# Patient Record
Sex: Female | Born: 1995 | Race: White | Hispanic: No | Marital: Single | Smoking: Former smoker
Health system: Southern US, Community
[De-identification: ages and names within clinical notes are randomized; demographics above are authoritative.]

## PROBLEM LIST (undated history)

## (undated) DIAGNOSIS — N920 Excessive and frequent menstruation with regular cycle: Secondary | ICD-10-CM

## (undated) DIAGNOSIS — Q249 Congenital malformation of heart, unspecified: Secondary | ICD-10-CM

## (undated) DIAGNOSIS — F419 Anxiety disorder, unspecified: Secondary | ICD-10-CM

## (undated) DIAGNOSIS — B078 Other viral warts: Secondary | ICD-10-CM

## (undated) DIAGNOSIS — L918 Other hypertrophic disorders of the skin: Secondary | ICD-10-CM

## (undated) DIAGNOSIS — N644 Mastodynia: Secondary | ICD-10-CM

## (undated) DIAGNOSIS — N83201 Unspecified ovarian cyst, right side: Secondary | ICD-10-CM

## (undated) DIAGNOSIS — D249 Benign neoplasm of unspecified breast: Secondary | ICD-10-CM

## (undated) DIAGNOSIS — N6011 Diffuse cystic mastopathy of right breast: Secondary | ICD-10-CM

## (undated) DIAGNOSIS — K582 Mixed irritable bowel syndrome: Secondary | ICD-10-CM

## (undated) DIAGNOSIS — K2900 Acute gastritis without bleeding: Secondary | ICD-10-CM

## (undated) DIAGNOSIS — N6012 Diffuse cystic mastopathy of left breast: Secondary | ICD-10-CM

## (undated) HISTORY — DX: Mixed irritable bowel syndrome: K58.2

## (undated) HISTORY — DX: Diffuse cystic mastopathy of right breast: N60.11

## (undated) HISTORY — DX: Unspecified ovarian cyst, right side: N83.201

## (undated) HISTORY — DX: Mastodynia: N64.4

## (undated) HISTORY — DX: Benign neoplasm of unspecified breast: D24.9

## (undated) HISTORY — DX: Acute gastritis without bleeding: K29.00

## (undated) HISTORY — DX: Diffuse cystic mastopathy of left breast: N60.12

## (undated) HISTORY — DX: Other viral warts: B07.8

## (undated) HISTORY — PX: WISDOM TOOTH EXTRACTION: SHX21

## (undated) HISTORY — DX: Other hypertrophic disorders of the skin: L91.8

## (undated) HISTORY — DX: Excessive and frequent menstruation with regular cycle: N92.0

---
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
## 2013-02-04 IMAGING — US US PELVIS COMPLETE
1 series · 13 of 25 positions shown · non-contrast
Comparison: 07/15/2012

CLINICAL DATA: Right-sided pelvic pain.   LMP 10/13/2012

TRANSABDOMINAL ULTRASOUND OF PELVIS
TECHNIQUE: Transabdominal ultrasound examination of the pelvis was
performed including evaluation of the uterus, ovaries, adnexal
regions, and pelvic cul-de-sac.

[Series 1: us pelvis complete · 0.20mm/px · 13 of 97 slices shown]
[im 1/97]
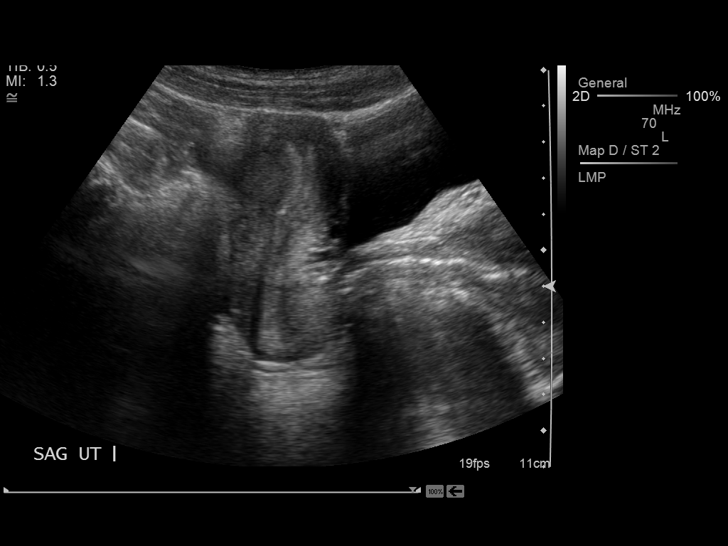
[im 9/97]
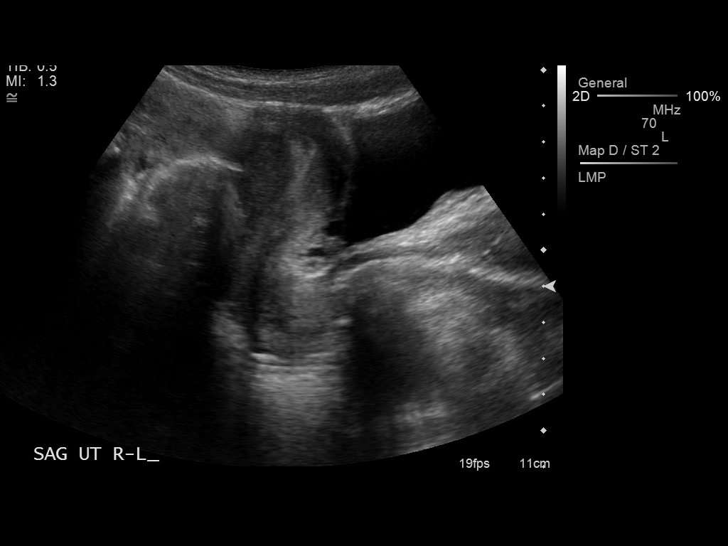
[im 17/97]
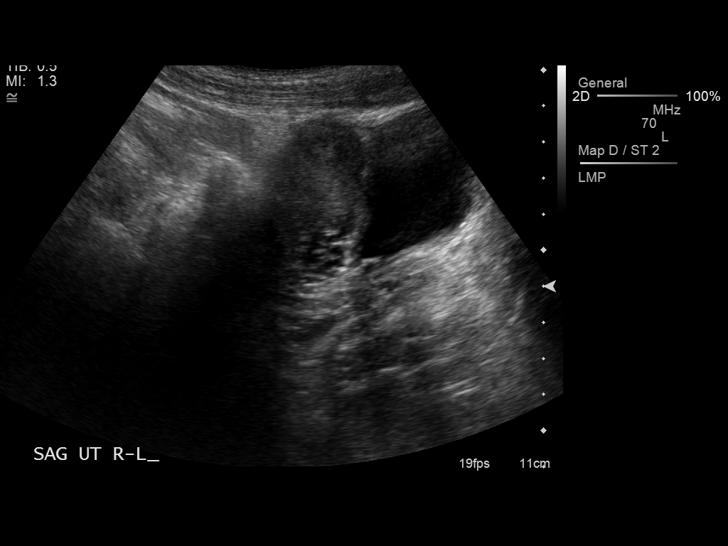
[im 25/97]
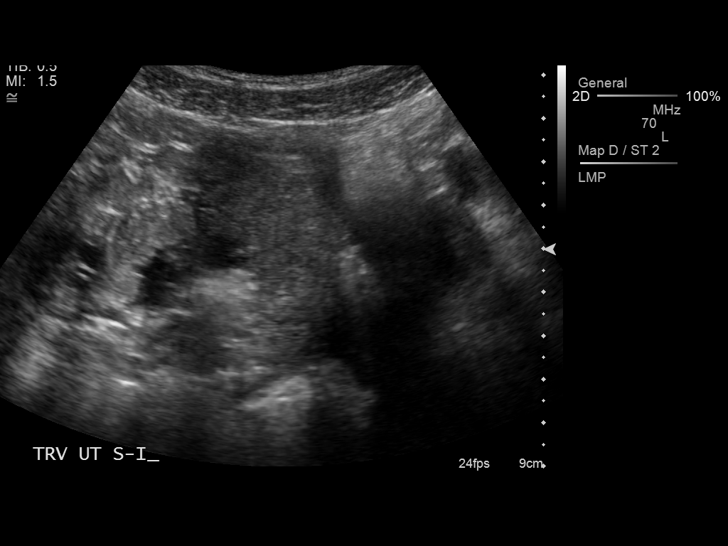
[im 33/97]
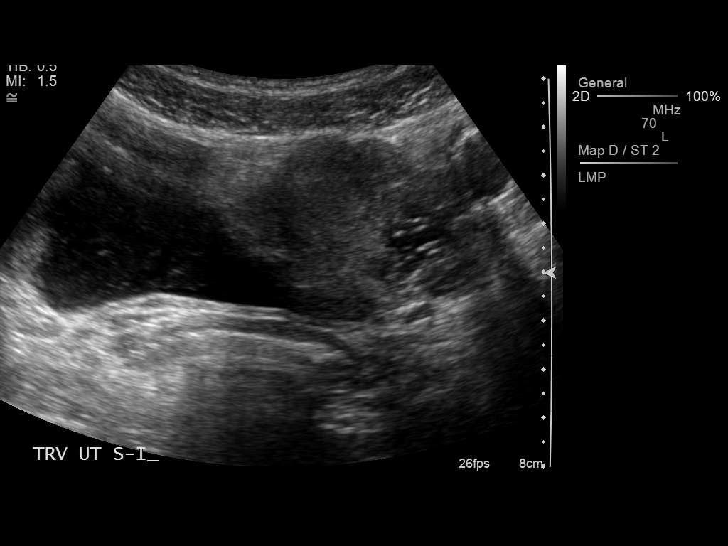
[im 41/97]
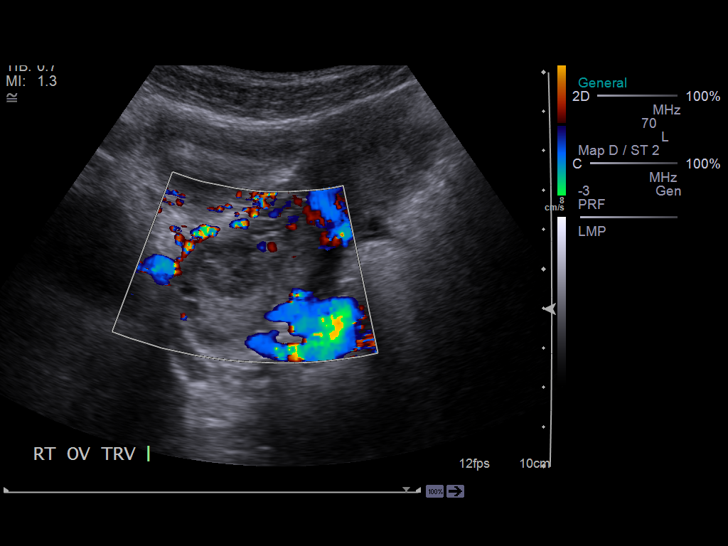
[im 49/97]
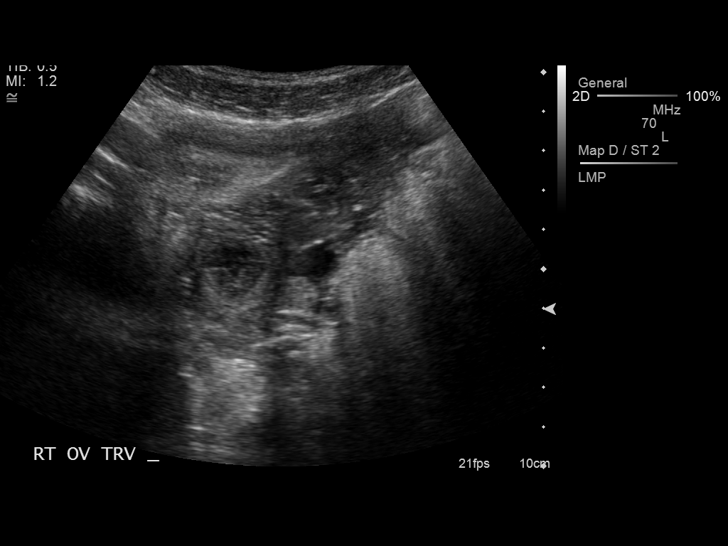
[im 57/97]
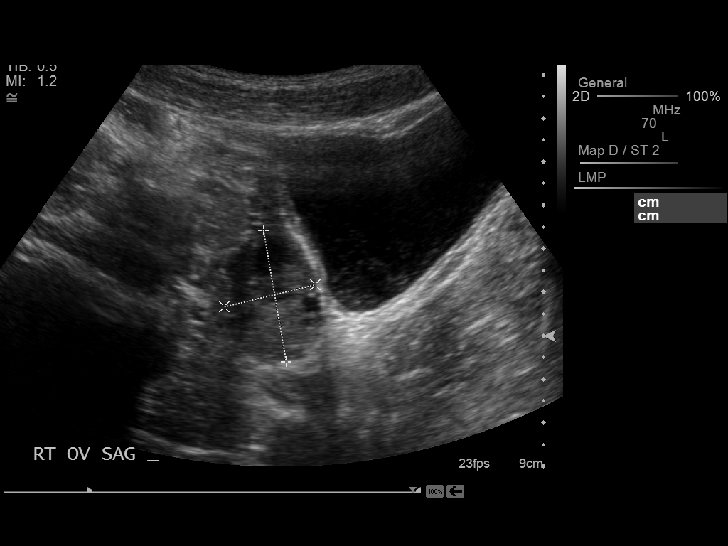
[im 65/97]
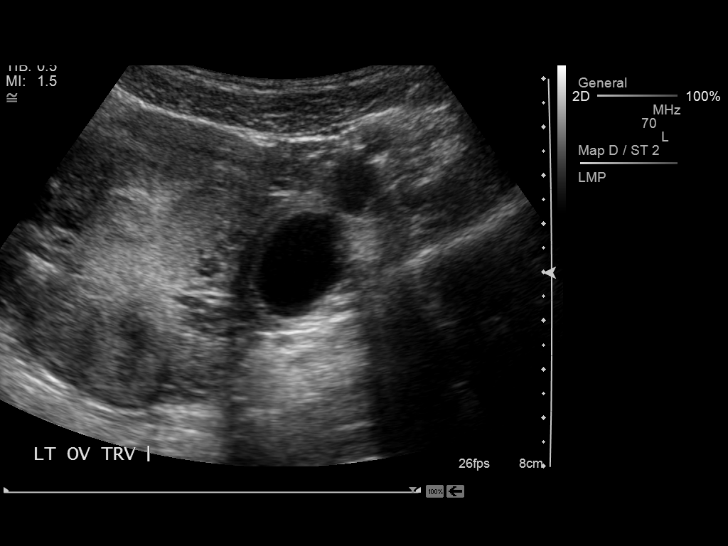
[im 73/97]
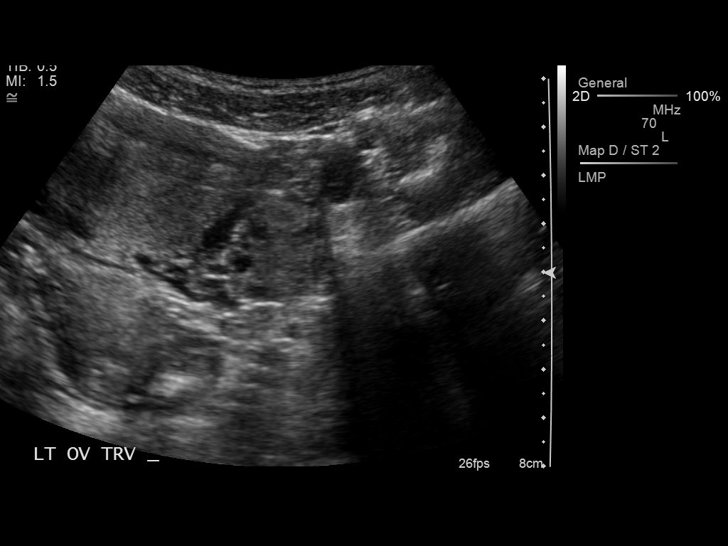
[im 81/97]
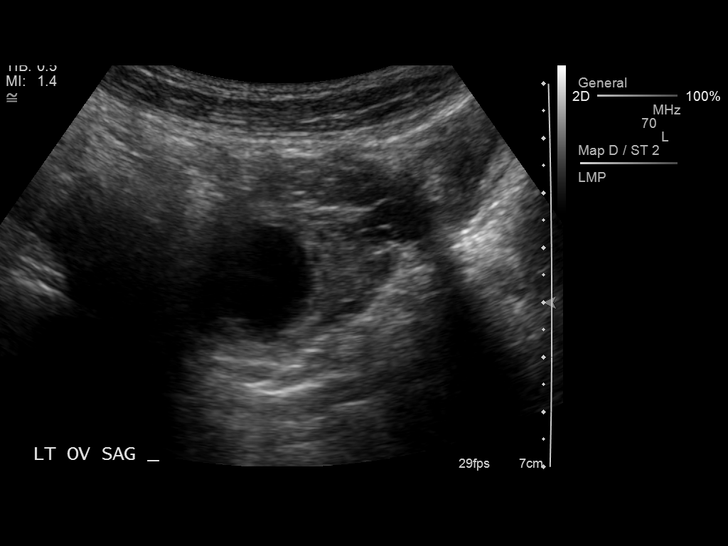
[im 89/97]
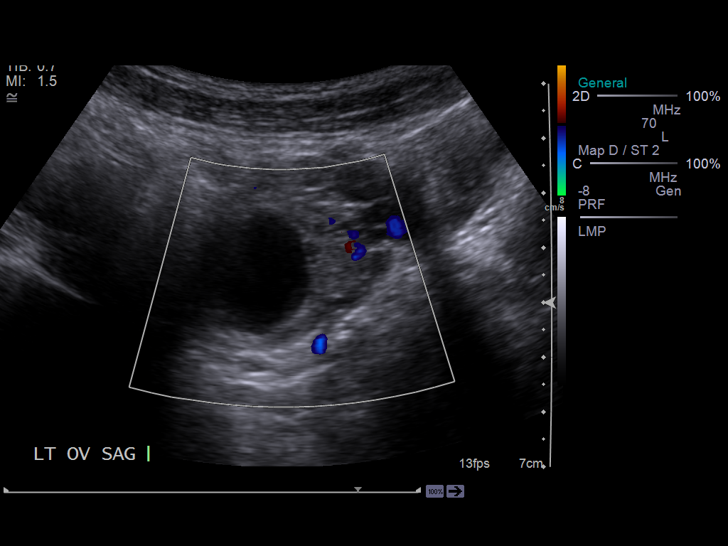
[im 97/97]
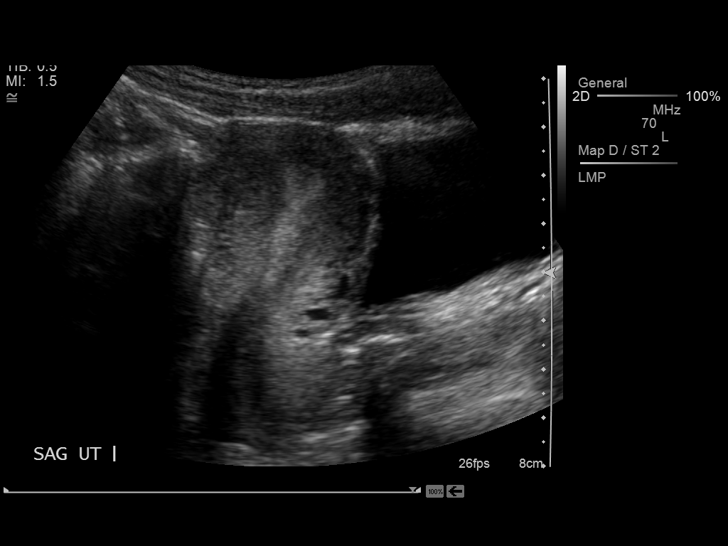

[13 of 25 positions shown; findings below may reference images not displayed]

FINDINGS: A transabdominal evaluation only was performed as the
patient is not sexually active.

Uterus:  Is anteverted and anteflexed and demonstrates a sagittal
length of 7.2 cm, depth of 3.4 cm and width of 4.2 cm. No focal
myometrial abnormalities are identified

Endometrium: A tri-layered pattern is suggested transabdominally
with a width of 8 mm.  The appearance would correlate with a
periovulatory endometrium and correspond with the provided LMP of
10/13/2012.

Right ovary: Measures 3.1 x 2.2 x 2.4 cm and contains a
circumscribed avascular hypoechoic focus measuring 1.8 x 1.5 x
cm.  Lack of flow and the transabdominal appearance suggests that
this may represent a hemorrhagic cyst.

Left ovary: Measures 3.8 x 2.7 x 2.0 cm and contains a dominant
follicle

Other Findings:  No pelvic fluid is seen and no separate adnexal
masses are noted.
IMPRESSION: Normal uterine myometrium, endometrium and left ovary.

Avascular small hypoechoic focus within the right ovary has an
appearance transabdominally which is suggestive of a hemorrhagic
cyst.  No confirmatory characteristics can be identified on the
transabdominal only assessment and given the history of right-sided
pain, reevaluation in the postsecretory phase of the cycle
following the next complete cycle can be performed to ensure
resolution of this suspected benign process.

## 2013-03-10 ENCOUNTER — Encounter: Payer: Self-pay | Admitting: Adult Health

## 2013-03-10 ENCOUNTER — Ambulatory Visit (INDEPENDENT_AMBULATORY_CARE_PROVIDER_SITE_OTHER): Payer: Medicaid Other | Admitting: Adult Health

## 2013-03-10 VITALS — BP 102/66 | HR 72 | Ht 62.0 in | Wt 107.0 lb

## 2013-03-10 DIAGNOSIS — R1031 Right lower quadrant pain: Secondary | ICD-10-CM

## 2013-03-10 DIAGNOSIS — Q249 Congenital malformation of heart, unspecified: Secondary | ICD-10-CM

## 2013-03-10 DIAGNOSIS — N83209 Unspecified ovarian cyst, unspecified side: Secondary | ICD-10-CM

## 2013-03-10 DIAGNOSIS — N83201 Unspecified ovarian cyst, right side: Secondary | ICD-10-CM

## 2013-03-10 HISTORY — DX: Unspecified ovarian cyst, right side: N83.201

## 2013-03-10 NOTE — Assessment & Plan Note (Signed)
History of rt ovarian cyst and has pain now will get pelvic ultrasound and follow up in 1 week

## 2013-03-10 NOTE — Progress Notes (Signed)
Subjective:     Patient ID: Rebecca Brown, female   DOB: 02-14-1996, 17 y.o.   MRN: [id number]  HPI Rebecca Brown is a 17 year old white female in with pain RLQ that radiates across lower pelvis, she has a history of right ovarian cyst and pain in this area.She had a stint placed in her heart in July 2013. She took micronor in past, but stopped secondary to irregular menses. Review of Systems Complaints as above. She is not sexually active and denies any bleeding or discharge.   Reviewed past medical,surgical, social and family history. Reviewed medications and allergies. Objective:   Physical Exam Blood pressure 102/66, pulse 72, height 5\' 2"  (1.575 m), weight 107 lb (48.535 kg), last menstrual period 02/17/2013.   Skin: warm and dry.Neck:mid-line trachea and normal thyroid.Lungs: clear to ausculation bilaterally.Cardiovascular:regular rate and rhythm.Abdomen: soft, non tender, no HSM. She is tender RLQ on palpation, did not do internal pelvic exam. She is alert and cooperative and has her grandmother with her. Assessment:    RLQ pain probable recurrent right ovarian cyst        Plan:     Will get pelvic ultrasound in 1 week and follow up with me to discuss

## 2013-03-10 NOTE — Patient Instructions (Addendum)
Sign up for my chart  Follow up 1 week for pelvic ultrasound and see me

## 2013-03-17 ENCOUNTER — Ambulatory Visit: Payer: Medicaid Other | Admitting: Adult Health

## 2013-03-17 ENCOUNTER — Other Ambulatory Visit: Payer: Medicaid Other

## 2013-03-30 ENCOUNTER — Ambulatory Visit: Payer: Medicaid Other | Admitting: Adult Health

## 2013-03-30 ENCOUNTER — Other Ambulatory Visit: Payer: Medicaid Other

## 2013-05-15 ENCOUNTER — Ambulatory Visit (INDEPENDENT_AMBULATORY_CARE_PROVIDER_SITE_OTHER): Payer: Medicaid Other | Admitting: Family Medicine

## 2013-05-15 ENCOUNTER — Encounter: Payer: Self-pay | Admitting: Family Medicine

## 2013-05-15 VITALS — BP 116/72 | Temp 98.3°F | Wt 101.4 lb

## 2013-05-15 DIAGNOSIS — K5289 Other specified noninfective gastroenteritis and colitis: Secondary | ICD-10-CM

## 2013-05-15 DIAGNOSIS — K529 Noninfective gastroenteritis and colitis, unspecified: Secondary | ICD-10-CM

## 2013-05-15 MED ORDER — HYOSCYAMINE SULFATE 0.125 MG SL SUBL: 0.1250 mg | SUBLINGUAL_TABLET | SUBLINGUAL | Status: DC | PRN

## 2013-05-15 MED ORDER — ONDANSETRON 4 MG PO TBDP: 4.0000 mg | ORAL_TABLET | Freq: Three times a day (TID) | ORAL | Status: DC | PRN

## 2013-05-15 NOTE — Progress Notes (Signed)
  Subjective:    Patient ID: Rebecca Brown, female    DOB: 1996/02/12, 17 y.o.   MRN: [id number]  Abdominal Pain This is a new problem. The current episode started in the past 7 days. The onset quality is gradual. The problem occurs 2 to 4 times per day. The pain is located in the epigastric region, periumbilical region and suprapubic region. The pain is at a severity of 5/10. The abdominal pain radiates to the epigastric region. Associated symptoms include diarrhea (a little diarrhea) and nausea. Pertinent negatives include no fever or headaches. The pain is aggravated by certain positions.  Diarrhea  Associated symptoms include abdominal pain. Pertinent negatives include no fever or headaches.      Review of Systems  Constitutional: Negative for fever.  Gastrointestinal: Positive for nausea, abdominal pain and diarrhea (a little diarrhea).  Neurological: Negative for headaches.   ROS otherwise negative.     Objective:   Physical Exam  Alert no acute distress. Mild malaise. Vitals reviewed. Lungs clear. Heart regular rate rhythm. Abdomen soft good bowel sounds hyperactive. Diffuse mild tenderness. No discrete pain.      Assessment & Plan:  Impression gastroenteritis likely viral-discussed. Plan symptomatic care discussed warning signs discussed. Zofran and Levsin prescribed. Expect gradual resolution. WSL

## 2013-08-19 ENCOUNTER — Ambulatory Visit: Payer: Medicaid Other | Admitting: Family Medicine

## 2013-09-08 ENCOUNTER — Ambulatory Visit (INDEPENDENT_AMBULATORY_CARE_PROVIDER_SITE_OTHER): Payer: Medicaid Other | Admitting: Family Medicine

## 2013-09-08 ENCOUNTER — Encounter: Payer: Self-pay | Admitting: Family Medicine

## 2013-09-08 VITALS — BP 112/78 | Temp 98.4°F | Ht 64.0 in | Wt 107.6 lb

## 2013-09-08 DIAGNOSIS — D649 Anemia, unspecified: Secondary | ICD-10-CM

## 2013-09-08 DIAGNOSIS — R079 Chest pain, unspecified: Secondary | ICD-10-CM

## 2013-09-08 DIAGNOSIS — R0609 Other forms of dyspnea: Secondary | ICD-10-CM

## 2013-09-08 DIAGNOSIS — R5381 Other malaise: Secondary | ICD-10-CM

## 2013-09-08 DIAGNOSIS — R0600 Dyspnea, unspecified: Secondary | ICD-10-CM

## 2013-09-08 NOTE — Progress Notes (Signed)
  Subjective:    Patient ID: Rebecca Brown, female    DOB: [birth year], 17 y.o.   MRN: [id number]  Breathing Problem She complains of chest tightness, difficulty breathing and shortness of breath. This is a recurrent problem. The current episode started more than 1 month ago. The problem occurs daily. The problem has been gradually worsening. Associated symptoms include chest pain. Associated symptoms comments: Dizziness. Her symptoms are aggravated by nothing. Her symptoms are alleviated by nothing.   this patient does have a history of ASD along with a slight dysfunction of the left ventricle she is followed by cardiology at [facility]. She has had echo it did not show any blood clots but it did show some slight dysfunction of the left ventricle. She's had pulmonary function tests in the past which were normal.  This patient describes her discomfort is more shortness of breath sometimes at rest sometimes with activity she denies being anxious or nervous. She denies having panic attacks. PMH benign otherwise Patient does not smoke no family history of premature heart disease  Review of Systems  Respiratory: Positive for shortness of breath.   Cardiovascular: Positive for chest pain.      [phone]  Objective:   Physical Exam Her lungs are clear hearts regular pulse normal abdomen soft extremities no edema skin warm dry neurologic grossly normal EKG looks normal.       Assessment & Plan:  Dyspnea -- we'll do lab work. If this does not explain what's going on then the next step would be is to go forward with possible cardiology referral. We will discuss case with cardiologist after lab work comes back

## 2013-09-10 LAB — CBC WITH DIFFERENTIAL/PLATELET
Eosinophils Absolute: 0.1 10*3/uL (ref 0.0–0.7)
Eosinophils Relative: 1 % (ref 0–5)
Hemoglobin: 13.2 g/dL (ref 12.0–15.0)
Lymphs Abs: 1.7 10*3/uL (ref 0.7–4.0)
MCH: 28.6 pg (ref 26.0–34.0)
MCV: 81.8 fL (ref 78.0–100.0)
Monocytes Relative: 9 % (ref 3–12)
RBC: 4.62 MIL/uL (ref 3.87–5.11)

## 2013-09-11 ENCOUNTER — Ambulatory Visit (HOSPITAL_COMMUNITY)
Admission: RE | Admit: 2013-09-11 | Discharge: 2013-09-11 | Disposition: A | Discharge status: Home / Self Care | Payer: Medicaid Other | Source: Ambulatory Visit | Attending: Family Medicine | Admitting: Family Medicine

## 2013-09-11 ENCOUNTER — Other Ambulatory Visit: Payer: Self-pay

## 2013-09-11 DIAGNOSIS — R0602 Shortness of breath: Secondary | ICD-10-CM | POA: Insufficient documentation

## 2013-09-11 DIAGNOSIS — R7989 Other specified abnormal findings of blood chemistry: Secondary | ICD-10-CM

## 2013-09-11 DIAGNOSIS — R079 Chest pain, unspecified: Secondary | ICD-10-CM | POA: Insufficient documentation

## 2013-09-11 LAB — BASIC METABOLIC PANEL
BUN: 15 mg/dL (ref 6–23)
CO2: 28 mEq/L (ref 19–32)
Chloride: 103 mEq/L (ref 96–112)
Creat: 0.75 mg/dL (ref 0.50–1.10)

## 2013-09-11 LAB — BRAIN NATRIURETIC PEPTIDE: Brain Natriuretic Peptide: 14.3 pg/mL (ref 0.0–100.0)

## 2013-09-11 LAB — FERRITIN: Ferritin: 38 ng/mL (ref 10–291)

## 2013-09-11 LAB — TSH: TSH: 1.494 u[IU]/mL (ref 0.350–4.500)

## 2013-09-11 MED ORDER — IOHEXOL 350 MG/ML SOLN
80.0000 mL | Freq: Once | INTRAVENOUS | Status: AC | PRN
  Administered 2013-09-11: 80 mL via INTRAVENOUS

## 2013-09-16 ENCOUNTER — Encounter: Payer: Self-pay | Admitting: Family Medicine

## 2014-06-24 ENCOUNTER — Encounter: Payer: Self-pay | Admitting: Family Medicine

## 2014-06-24 ENCOUNTER — Ambulatory Visit (INDEPENDENT_AMBULATORY_CARE_PROVIDER_SITE_OTHER): Payer: 59 | Admitting: Family Medicine

## 2014-06-24 VITALS — BP 112/80 | Temp 98.5°F | Ht 64.0 in | Wt 116.5 lb

## 2014-06-24 DIAGNOSIS — R079 Chest pain, unspecified: Secondary | ICD-10-CM

## 2014-06-24 DIAGNOSIS — R0789 Other chest pain: Secondary | ICD-10-CM

## 2014-06-24 MED ORDER — MELOXICAM 15 MG PO TABS: 15.0000 mg | ORAL_TABLET | Freq: Every day | ORAL | Status: DC

## 2014-06-24 NOTE — Progress Notes (Signed)
   Subjective:    Patient ID: Rebecca Brown, female    DOB: February 05, 1996, 18 y.o.   MRN: [id number]  Chest Pain  This is a recurrent problem. The current episode started more than 1 year ago. The onset quality is gradual. The problem occurs intermittently. The problem has been unchanged. The pain is moderate. The quality of the pain is described as sharp. The pain does not radiate. She has tried nothing for the symptoms. The treatment provided no relief. There are no known risk factors.  Patient states she has no other concerns at this time.    Review of Systems  Cardiovascular: Positive for chest pain.   no wheezing no shortness of breath no nausea vomiting no fevers     Objective:   Physical Exam Neck no masses lungs are clear no crackles heart is regular no murmurs chest wall mild tenderness no guarding or rebound extremities no edema EKG no acute changes       Assessment & Plan:  Musculoskeletal chest pain Mobic for the next couple weeks I do not feel that this is any sign of heart failure or reoccurrence of her heart problem May return to work without restrictions  Will see cardiology in a couple weeks for followup.

## 2014-07-09 ENCOUNTER — Encounter: Payer: Self-pay | Admitting: Family Medicine

## 2014-09-22 ENCOUNTER — Encounter: Payer: Self-pay | Admitting: Family Medicine

## 2014-09-22 ENCOUNTER — Ambulatory Visit (INDEPENDENT_AMBULATORY_CARE_PROVIDER_SITE_OTHER): Payer: 59 | Admitting: Family Medicine

## 2014-09-22 VITALS — BP 112/76 | Temp 98.4°F | Ht 62.0 in | Wt 121.0 lb

## 2014-09-22 DIAGNOSIS — R0789 Other chest pain: Secondary | ICD-10-CM

## 2014-09-22 DIAGNOSIS — J208 Acute bronchitis due to other specified organisms: Secondary | ICD-10-CM

## 2014-09-22 MED ORDER — AZITHROMYCIN 250 MG PO TABS
ORAL_TABLET | ORAL | Status: DC
Start: 2014-09-22 — End: 2015-09-15

## 2014-09-22 NOTE — Progress Notes (Signed)
   Subjective:    Patient ID: Rebecca Brown, female    DOB: 07-03-1996, 18 y.o.   MRN: [id number]  Wheezing  This is a new problem. The current episode started yesterday. Associated symptoms include coughing and diarrhea. Associated symptoms comments: Body aches, runny nose, abd pain, diarrhea. She has tried nothing for the symptoms.  patient is concerned about the soreness in her chest  She has a history of heart issues but she's been checked out thoroughly recently by the specialist was found that her heart was doing good.  Review of Systems  Respiratory: Positive for cough and wheezing.   Gastrointestinal: Positive for diarrhea.       Objective:   Physical Exam Lungs sound very clear heart regular pulse normal extremities no edema chest wall mild tenderness  She does have tenderness in the chest wall anteriorly     Assessment & Plan:  Patient relates she will follow-up with nurse practitioner regarding fibroadenomas to have these checked she may need referral back to the surgeon possibly.  I believe that she does have a mild viral illness with secondary bronchitis antibiotics prescribed she should gradually get better warning signs discuss

## 2015-07-26 ENCOUNTER — Telehealth: Payer: Self-pay | Admitting: *Deleted

## 2015-07-26 NOTE — Telephone Encounter (Signed)
Pt called having chest pain and shortness of breath and left arm pain for the past 2 months. Went to [facility] ed about 1 month ago and pt states they did test and could not find anything wrong. Pt did have heart surgery 3 years ago. Pt has cardiologist in [place] dr buck. Has not seen in about 1 year. Pt advised to go directly to ed with the symptoms she is having. Pt verbalized understanding.

## 2015-09-15 ENCOUNTER — Ambulatory Visit (INDEPENDENT_AMBULATORY_CARE_PROVIDER_SITE_OTHER): Payer: BLUE CROSS/BLUE SHIELD | Admitting: Family Medicine

## 2015-09-15 VITALS — BP 118/78 | Temp 98.5°F | Ht 62.0 in | Wt 132.0 lb

## 2015-09-15 DIAGNOSIS — R05 Cough: Secondary | ICD-10-CM

## 2015-09-15 DIAGNOSIS — J301 Allergic rhinitis due to pollen: Secondary | ICD-10-CM

## 2015-09-15 DIAGNOSIS — R059 Cough, unspecified: Secondary | ICD-10-CM

## 2015-09-15 MED ORDER — AMOXICILLIN 500 MG PO TABS: 500.0000 mg | ORAL_TABLET | Freq: Three times a day (TID) | ORAL | Status: DC

## 2015-09-15 MED ORDER — FLUTICASONE PROPIONATE 50 MCG/ACT NA SUSP: 2.0000 | Freq: Every day | NASAL | Status: DC

## 2015-09-15 NOTE — Patient Instructions (Signed)
Loratadine 10 mg ( the generic of Claritin) one daily Amoxil 500 one 3 times a day for 10 days  Do the allergy tablet and spray over the next 3 weeks,if throat issue not better then next step- call us- we would help set up appt with ENT

## 2015-09-15 NOTE — Progress Notes (Signed)
   Subjective:    Patient ID: Rebecca Brown, female    DOB: November 08, 1996, 19 y.o.   MRN: [id number]  Cough This is a new problem. The current episode started 1 to 4 weeks ago. The problem occurs every few minutes. The cough is productive of sputum. Associated symptoms comments: Congestion . She has tried OTC cough suppressant (Mucinex) for the symptoms. The treatment provided mild relief.   Patient states no other concerns this visit.    Review of Systems  Respiratory: Positive for cough.    Patient relates she has a frequent cough she describes it more as a clearing in her throat she denies any postnasal drip sneezing wheezing difficulty breathing or high fevers    Objective:   Physical Exam No sinus tenderness eardrums normal throat is normal neck no masses lungs are clear hearts regular she clears her throat frequently during our visit  Patient with recent cardiac catheterization which look good     Assessment & Plan:  Possible postnasal drip possible habitual cough find no evidence of pulmonary issues I would recommend allergy medicine allergy spray and antibiotic if not significantly better over the next 3 weeks referral to ENT I find no evidence of reflux.

## 2016-01-05 ENCOUNTER — Encounter: Payer: Self-pay | Admitting: Family Medicine

## 2016-01-05 ENCOUNTER — Ambulatory Visit (INDEPENDENT_AMBULATORY_CARE_PROVIDER_SITE_OTHER): Payer: BLUE CROSS/BLUE SHIELD | Admitting: Family Medicine

## 2016-01-05 VITALS — BP 114/80 | Ht 62.0 in | Wt 134.5 lb

## 2016-01-05 DIAGNOSIS — S41111D Laceration without foreign body of right upper arm, subsequent encounter: Secondary | ICD-10-CM

## 2016-01-05 NOTE — Progress Notes (Signed)
   Subjective:    Patient ID: Rebecca Brown, female    DOB: Oct 28, 1996, 20 y.o.   MRN: [id number]  Arm Injury  The incident occurred 3 to 5 days ago. The pain is present in the right forearm. Treatments tried: Stitches.   this injury occurred at work she cut it on a piece of metal. She had stitches placed at [facility]  patient states when she makes a fist that pulls on the laceration causes her pain discomfort patient does not feel she is able to work. Certainly if there was 1 light duty to where they could immobilize her right arm in regards to working I think she could work but we will go ahead and give her a note for a couple days. Patient states no other concerns this visit.  Review of Systems  see above    Objective:   Physical Exam   upper arm normal biceps normal forearm slight tenderness near the laceration no sign of a infection. The hand appears normal. And normal function normal strength and normal sensation      Assessment & Plan:   15 minutes spent with patient greater than half in discussion Stretching exercises recommended gentle resumption of right arm duties recommended work excuse for Thursday and Friday hopefully return to work next week follow-up if you neck suite for suture removal warning signs of infection discussed

## 2016-01-13 ENCOUNTER — Ambulatory Visit (INDEPENDENT_AMBULATORY_CARE_PROVIDER_SITE_OTHER): Payer: BLUE CROSS/BLUE SHIELD | Admitting: Family Medicine

## 2016-01-13 ENCOUNTER — Encounter: Payer: Self-pay | Admitting: Family Medicine

## 2016-01-13 VITALS — Ht 62.0 in | Wt 134.2 lb

## 2016-01-13 DIAGNOSIS — S41111D Laceration without foreign body of right upper arm, subsequent encounter: Secondary | ICD-10-CM | POA: Diagnosis not present

## 2016-01-13 NOTE — Progress Notes (Signed)
   Subjective:    Patient ID: Rebecca Brown, female    DOB: 1996/07/03, 20 y.o.   MRN: [id number]  HPI  Patient arrives to have sutures removed from right arm She had sutures placed approximately 2 weeks ago. She is continued to work but does not do any significant lifting. She denies any drainage or redness from the cut. Review of Systems     Objective:   Physical Exam Sutures were removed without difficulty. It is apparent that on a few areas of the arm there is poor healing of the skin. Steri-Strips were applied. No sign of any type of secondary infection noted.  15 minutes spent with the patient discussing the laceration discussing work restrictions answering questions removal of stitches and Steri-Strips placed, greater than half was spent in discussion     Assessment & Plan:  The patient was warned what to watch for regarding secondary infection Steri-Strips were applied with benzoin in order to keep the skin approximated as best as possible she is to avoid frequent flexion and avoid any significant lifting over the next 7-10 days and to follow-up if ongoing troubles

## 2016-01-18 ENCOUNTER — Encounter: Payer: Self-pay | Admitting: Family Medicine

## 2016-01-18 ENCOUNTER — Ambulatory Visit (INDEPENDENT_AMBULATORY_CARE_PROVIDER_SITE_OTHER): Payer: BLUE CROSS/BLUE SHIELD | Admitting: Family Medicine

## 2016-01-18 VITALS — BP 110/72 | Temp 97.6°F | Wt 135.0 lb

## 2016-01-18 DIAGNOSIS — S41111D Laceration without foreign body of right upper arm, subsequent encounter: Secondary | ICD-10-CM | POA: Diagnosis not present

## 2016-01-18 DIAGNOSIS — R202 Paresthesia of skin: Secondary | ICD-10-CM

## 2016-01-18 DIAGNOSIS — R200 Anesthesia of skin: Secondary | ICD-10-CM

## 2016-01-18 NOTE — Progress Notes (Signed)
   Subjective:    Patient ID: Rebecca Brown, female    DOB: 1996/06/30, 20 y.o.   MRN: [id number]  HPIFollow up laceration right arm. Happened feb 13th.  Has steri - strips on. Pt has been having numbness in right arm from elbow down to fingers off and on since incident.   see discussion below She states that she is been started have some intermittent numbness in the right arm from the elbow down to the fingers off and on since he incident she relates that at times the numbness is the whole hand other times is just part of the hand. She denies fever chills sweats or drainage  on documentation from her first visit after the laceration she had normal sensation and normal function  Review of Systems     Objective:   Physical Exam  on physical exam the laceration appears to be well-healing it's under Steri-Strips currently there is no sign of any type of infection or swelling of the arm. Her index finger slightly bigger than the index finger on the other side but I do not see any signs of any worrisome infection She has subjective numbness and tingling into the right hand that she states is been going on over the past 7-10 days ever since a laceration but did not occur at the time of the laceration. She states sometimes the hand feels a little bit weak       Assessment & Plan:   it is possible that the patient may be developing a reflex sympathetic dystrophy I believe this patient would benefit from seeing a hand specialist as well as have nerve conduction studies of the arm I told her that she does need to talk to her Charity fundraiser in order to make sure that this is being filed through [employer]. I also told the patient that her workplace may decide day 1 her to see someone specific other than who we will refer to an if so she is to have them help set that up.

## 2016-01-20 ENCOUNTER — Telehealth: Payer: Self-pay | Admitting: Family Medicine

## 2016-01-20 NOTE — Telephone Encounter (Signed)
Called patient to verify if her injury was a worker's comp case, patient states yes it is, explained to the patient that we do not treat worker's comp cases and will be unable to process her referral to the hand specialist  Explained to patient that she will need to get in touch with her manager or Human Resources department to get information on where she can be treated and possibly get their worker's comp doctor to refer her to the hand specialist, patient verbalized understanding

## 2016-01-26 ENCOUNTER — Encounter: Payer: Self-pay | Admitting: Family

## 2016-01-26 ENCOUNTER — Ambulatory Visit (INDEPENDENT_AMBULATORY_CARE_PROVIDER_SITE_OTHER): Payer: Worker's Compensation | Admitting: Family

## 2016-01-26 VITALS — BP 124/72 | HR 80 | Temp 97.7°F | Ht 62.0 in | Wt 137.4 lb

## 2016-01-26 DIAGNOSIS — M79601 Pain in right arm: Secondary | ICD-10-CM | POA: Diagnosis not present

## 2016-01-26 DIAGNOSIS — M792 Neuralgia and neuritis, unspecified: Secondary | ICD-10-CM

## 2016-01-26 MED ORDER — NAPROXEN 500 MG PO TABS: 500.0000 mg | ORAL_TABLET | Freq: Two times a day (BID) | ORAL | Status: DC

## 2016-01-26 MED ORDER — GABAPENTIN 300 MG PO CAPS: 300.0000 mg | ORAL_CAPSULE | Freq: Three times a day (TID) | ORAL | Status: DC

## 2016-01-26 NOTE — Telephone Encounter (Signed)
Patient was seen by Workmen's Comp. doctor they are working with her if they are not getting things better they will send her to a hand specialist

## 2016-01-26 NOTE — Progress Notes (Signed)
   Subjective:    Patient ID: Rebecca Brown, female    DOB: 07-Jan-1996, 20 y.o.   MRN: [id number]  HPI Pt presents to the office today for a workers comp injury for [employer]. PT states she has a laceration of her right AC on 02/13. Pt states since getting her sutures out she has had intermittent numbness and tingling that shoots down to her right wrist and up to her shoulder. PT states she has intermittent sharp pain of 6 out 10. PT states she hasn't taken anything for it.    Review of Systems  Constitutional: Negative.   HENT: Negative.   Eyes: Negative.   Respiratory: Negative.  Negative for shortness of breath.   Cardiovascular: Negative.  Negative for palpitations.  Gastrointestinal: Negative.   Endocrine: Negative.   Genitourinary: Negative.   Musculoskeletal: Negative.   Neurological: Negative.  Negative for headaches.  Hematological: Negative.   Psychiatric/Behavioral: Negative.   All other systems reviewed and are negative.      Objective:   Physical Exam  Constitutional: She is oriented to person, place, and time. She appears well-developed and well-nourished. No distress.  HENT:  Head: Normocephalic.  Eyes: Pupils are equal, round, and reactive to light.  Neck: Normal range of motion. Neck supple. No thyromegaly present.  Cardiovascular: Normal rate, regular rhythm, normal heart sounds and intact distal pulses.   No murmur heard. Pulmonary/Chest: Effort normal and breath sounds normal. No respiratory distress. She has no wheezes.  Abdominal: Soft. Bowel sounds are normal. She exhibits no distension. There is no tenderness.  Musculoskeletal: Normal range of motion. She exhibits tenderness. She exhibits no edema.  Mild tenderness in right AC area with palpation   Neurological: She is alert and oriented to person, place, and time. She has normal reflexes. No cranial nerve deficit.  Skin: Skin is warm and dry.  Psychiatric: She has a normal mood and affect. Her behavior is  normal. Judgment and thought content normal.  Vitals reviewed.   BP 124/72 mmHg  Pulse 80  Temp(Src) 97.7 F (36.5 C) (Oral)  Ht 5\' 2"  (1.575 m)  Wt 137 lb 6.4 oz (62.324 kg)  BMI 25.12 kg/m2       Assessment & Plan:  1. Nerve pain - gabapentin (NEURONTIN) 300 MG capsule; Take 1 capsule (300 mg total) by mouth 3 (three) times daily.  Dispense: 90 capsule; Refill: 3 - naproxen (NAPROSYN) 500 MG tablet; Take 1 tablet (500 mg total) by mouth 2 (two) times daily with a meal.  Dispense: 60 tablet; Refill: 1  2. Right arm pain - gabapentin (NEURONTIN) 300 MG capsule; Take 1 capsule (300 mg total) by mouth 3 (three) times daily.  Dispense: 90 capsule; Refill: 3 - naproxen (NAPROSYN) 500 MG tablet; Take 1 tablet (500 mg total) by mouth 2 (two) times daily with a meal.  Dispense: 60 tablet; Refill: 1  Pt told that we would try antiinflammatory medication first- If the pain does not improve will send to hand specialists  RTO in 2 weeks Pt may resume regular duties at work  [employer], [place]

## 2016-02-03 ENCOUNTER — Ambulatory Visit: Payer: BLUE CROSS/BLUE SHIELD | Admitting: Nurse Practitioner

## 2016-02-03 DIAGNOSIS — Z029 Encounter for administrative examinations, unspecified: Secondary | ICD-10-CM

## 2016-02-09 ENCOUNTER — Ambulatory Visit (INDEPENDENT_AMBULATORY_CARE_PROVIDER_SITE_OTHER): Payer: Worker's Compensation | Admitting: Family

## 2016-02-09 ENCOUNTER — Encounter: Payer: Self-pay | Admitting: Family

## 2016-02-09 VITALS — BP 111/71 | HR 68 | Temp 98.1°F | Ht 62.0 in | Wt 138.4 lb

## 2016-02-09 DIAGNOSIS — M792 Neuralgia and neuritis, unspecified: Secondary | ICD-10-CM | POA: Diagnosis not present

## 2016-02-09 DIAGNOSIS — M79601 Pain in right arm: Secondary | ICD-10-CM

## 2016-02-09 MED ORDER — GABAPENTIN 400 MG PO CAPS: 400.0000 mg | ORAL_CAPSULE | Freq: Three times a day (TID) | ORAL | Status: DC

## 2016-02-09 NOTE — Patient Instructions (Addendum)
Health Maintenance, Female Adopting a healthy lifestyle and getting preventive care can go a long way to promote health and wellness. Talk with your health care provider about what schedule of regular examinations is right for you. This is a good chance for you to check in with your provider about disease prevention and staying healthy. In between checkups, there are plenty of things you can do on your own. Experts have done a lot of research about which lifestyle changes and preventive measures are most likely to keep you healthy. Ask your health care provider for more information. WEIGHT AND DIET  Eat a healthy diet  Be sure to include plenty of vegetables, fruits, low-fat dairy products, and lean protein.  Do not eat a lot of foods high in solid fats, added sugars, or salt.  Get regular exercise. This is one of the most important things you can do for your health.  Most adults should exercise for at least 150 minutes each week. The exercise should increase your heart rate and make you sweat (moderate-intensity exercise).  Most adults should also do strengthening exercises at least twice a week. This is in addition to the moderate-intensity exercise.  Maintain a healthy weight  Body mass index (BMI) is a measurement that can be used to identify possible weight problems. It estimates body fat based on height and weight. Your health care provider can help determine your BMI and help you achieve or maintain a healthy weight.  For females 20 years of age and older:   A BMI below 18.5 is considered underweight.  A BMI of 18.5 to 24.9 is normal.  A BMI of 25 to 29.9 is considered overweight.  A BMI of 30 and above is considered obese.  Watch levels of cholesterol and blood lipids  You should start having your blood tested for lipids and cholesterol at 22 years of age, then have this test every 5 years.  You may need to have your cholesterol levels checked more often if:  Your lipid  or cholesterol levels are high.  You are older than 22 years of age.  You are at high risk for heart disease.  CANCER SCREENING   Lung Cancer  Lung cancer screening is recommended for adults 55-80 years old who are at high risk for lung cancer because of a history of smoking.  A yearly low-dose CT scan of the lungs is recommended for people who:  Currently smoke.  Have quit within the past 15 years.  Have at least a 30-pack-year history of smoking. A pack year is smoking an average of one pack of cigarettes a day for 1 year.  Yearly screening should continue until it has been 15 years since you quit.  Yearly screening should stop if you develop a health problem that would prevent you from having lung cancer treatment.  Breast Cancer  Practice breast self-awareness. This means understanding how your breasts normally appear and feel.  It also means doing regular breast self-exams. Let your health care provider know about any changes, no matter how small.  If you are in your 20s or 30s, you should have a clinical breast exam (CBE) by a health care provider every 1-3 years as part of a regular health exam.  If you are 40 or older, have a CBE every year. Also consider having a breast X-ray (mammogram) every year.  If you have a family history of breast cancer, talk to your health care provider about genetic screening.  If you   are at high risk for breast cancer, talk to your health care provider about having an MRI and a mammogram every year.  Breast cancer gene (BRCA) assessment is recommended for women who have family members with BRCA-related cancers. BRCA-related cancers include:  Breast.  Ovarian.  Tubal.  Peritoneal cancers.  Results of the assessment will determine the need for genetic counseling and BRCA1 and BRCA2 testing. Cervical Cancer Your health care provider may recommend that you be screened regularly for cancer of the pelvic organs (ovaries, uterus, and  vagina). This screening involves a pelvic examination, including checking for microscopic changes to the surface of your cervix (Pap test). You may be encouraged to have this screening done every 3 years, beginning at age 21.  For women ages 30-65, health care providers may recommend pelvic exams and Pap testing every 3 years, or they may recommend the Pap and pelvic exam, combined with testing for human papilloma virus (HPV), every 5 years. Some types of HPV increase your risk of cervical cancer. Testing for HPV may also be done on women of any age with unclear Pap test results.  Other health care providers may not recommend any screening for nonpregnant women who are considered low risk for pelvic cancer and who do not have symptoms. Ask your health care provider if a screening pelvic exam is right for you.  If you have had past treatment for cervical cancer or a condition that could lead to cancer, you need Pap tests and screening for cancer for at least 20 years after your treatment. If Pap tests have been discontinued, your risk factors (such as having a new sexual partner) need to be reassessed to determine if screening should resume. Some women have medical problems that increase the chance of getting cervical cancer. In these cases, your health care provider may recommend more frequent screening and Pap tests. Colorectal Cancer  This type of cancer can be detected and often prevented.  Routine colorectal cancer screening usually begins at 22 years of age and continues through 22 years of age.  Your health care provider may recommend screening at an earlier age if you have risk factors for colon cancer.  Your health care provider may also recommend using home test kits to check for hidden blood in the stool.  A small camera at the end of a tube can be used to examine your colon directly (sigmoidoscopy or colonoscopy). This is done to check for the earliest forms of colorectal  cancer.  Routine screening usually begins at age 50.  Direct examination of the colon should be repeated every 5-10 years through 22 years of age. However, you may need to be screened more often if early forms of precancerous polyps or small growths are found. Skin Cancer  Check your skin from head to toe regularly.  Tell your health care provider about any new moles or changes in moles, especially if there is a change in a mole's shape or color.  Also tell your health care provider if you have a mole that is larger than the size of a pencil eraser.  Always use sunscreen. Apply sunscreen liberally and repeatedly throughout the day.  Protect yourself by wearing long sleeves, pants, a wide-brimmed hat, and sunglasses whenever you are outside. HEART DISEASE, DIABETES, AND HIGH BLOOD PRESSURE   High blood pressure causes heart disease and increases the risk of stroke. High blood pressure is more likely to develop in:  People who have blood pressure in the high end   of the normal range (130-139/85-89 mm Hg).  People who are overweight or obese.  People who are African American.  If you are 38-23 years of age, have your blood pressure checked every 3-5 years. If you are 61 years of age or older, have your blood pressure checked every year. You should have your blood pressure measured twice--once when you are at a hospital or clinic, and once when you are not at a hospital or clinic. Record the average of the two measurements. To check your blood pressure when you are not at a hospital or clinic, you can use:  An automated blood pressure machine at a pharmacy.  A home blood pressure monitor.  If you are between 45 years and 39 years old, ask your health care provider if you should take aspirin to prevent strokes.  Have regular diabetes screenings. This involves taking a blood sample to check your fasting blood sugar level.  If you are at a normal weight and have a low risk for diabetes,  have this test once every three years after 22 years of age.  If you are overweight and have a high risk for diabetes, consider being tested at a younger age or more often. PREVENTING INFECTION  Hepatitis B  If you have a higher risk for hepatitis B, you should be screened for this virus. You are considered at high risk for hepatitis B if:  You were born in a country where hepatitis B is common. Ask your health care provider which countries are considered high risk.  Your parents were born in a high-risk country, and you have not been immunized against hepatitis B (hepatitis B vaccine).  You have HIV or AIDS.  You use needles to inject street drugs.  You live with someone who has hepatitis B.  You have had sex with someone who has hepatitis B.  You get hemodialysis treatment.  You take certain medicines for conditions, including cancer, organ transplantation, and autoimmune conditions. Hepatitis C  Blood testing is recommended for:  Everyone born from [birth year] through 1967.  Anyone with known risk factors for hepatitis C. Sexually transmitted infections (STIs)  You should be screened for sexually transmitted infections (STIs) including gonorrhea and chlamydia if:  You are sexually active and are younger than 22 years of age.  You are older than 22 years of age and your health care provider tells you that you are at risk for this type of infection.  Your sexual activity has changed since you were last screened and you are at an increased risk for chlamydia or gonorrhea. Ask your health care provider if you are at risk.  If you do not have HIV, but are at risk, it may be recommended that you take a prescription medicine daily to prevent HIV infection. This is called pre-exposure prophylaxis (PrEP). You are considered at risk if:  You are sexually active and do not regularly use condoms or know the HIV status of your partner(s).  You take drugs by injection.  You are sexually  active with a partner who has HIV. Talk with your health care provider about whether you are at high risk of being infected with HIV. If you choose to begin PrEP, you should first be tested for HIV. You should then be tested every 3 months for as long as you are taking PrEP.  PREGNANCY   If you are premenopausal and you may become pregnant, ask your health care provider about preconception counseling.  If you may  become pregnant, take 400 to 800 micrograms (mcg) of folic acid every day.  If you want to prevent pregnancy, talk to your health care provider about birth control (contraception). OSTEOPOROSIS AND MENOPAUSE   Osteoporosis is a disease in which the bones lose minerals and strength with aging. This can result in serious bone fractures. Your risk for osteoporosis can be identified using a bone density scan.  If you are 65 years of age or older, or if you are at risk for osteoporosis and fractures, ask your health care provider if you should be screened.  Ask your health care provider whether you should take a calcium or vitamin D supplement to lower your risk for osteoporosis.  Menopause may have certain physical symptoms and risks.  Hormone replacement therapy may reduce some of these symptoms and risks. Talk to your health care provider about whether hormone replacement therapy is right for you.  HOME CARE INSTRUCTIONS   Schedule regular health, dental, and eye exams.  Stay current with your immunizations.   Do not use any tobacco products including cigarettes, chewing tobacco, or electronic cigarettes.  If you are pregnant, do not drink alcohol.  If you are breastfeeding, limit how much and how often you drink alcohol.  Limit alcohol intake to no more than 1 drink per day for nonpregnant women. One drink equals 12 ounces of beer, 5 ounces of wine, or 1 ounces of hard liquor.  Do not use street drugs.  Do not share needles.  Ask your health care provider for help if  you need support or information about quitting drugs.  Tell your health care provider if you often feel depressed.  Tell your health care provider if you have ever been abused or do not feel safe at home.   This information is not intended to replace advice given to you by your health care provider. Make sure you discuss any questions you have with your health care provider.   Document Released: 05/21/2011 Document Revised: 11/26/2014 Document Reviewed: 10/07/2013 Elsevier Interactive Patient Education 2016 [employer] Inc[employer]  

## 2016-02-09 NOTE — Progress Notes (Signed)
   Subjective:    Patient ID: Rebecca Brown, female    DOB: 03/14/1996, 20 y.o.   MRN: [id number]  HPI Pt presents to the office today for a follow up on a workers comp injury for [employer]. PT states she has a laceration of her right AC on 02/13 and has since had intermittent numbness and tingling that shoots down to her right wrist and up to her shoulder. Pt was given naprosyn and gabapentin/. Pt reports the numbness is improved, but continues to have intermittent sharp pain of 7 out 10. PT states she can not think of anything that triggers this pain and states it is "just random".   Review of Systems  Constitutional: Negative.   HENT: Negative.   Eyes: Negative.   Respiratory: Negative.  Negative for shortness of breath.   Cardiovascular: Negative.  Negative for palpitations.  Gastrointestinal: Negative.   Endocrine: Negative.   Genitourinary: Negative.   Musculoskeletal: Negative.   Neurological: Negative.  Negative for headaches.  Hematological: Negative.   Psychiatric/Behavioral: Negative.   All other systems reviewed and are negative.      Objective:   Physical Exam  Constitutional: She is oriented to person, place, and time. She appears well-developed and well-nourished. No distress.  HENT:  Head: Normocephalic.  Eyes: Pupils are equal, round, and reactive to light.  Neck: Normal range of motion. Neck supple. No thyromegaly present.  Cardiovascular: Normal rate, regular rhythm, normal heart sounds and intact distal pulses.   No murmur heard. Pulmonary/Chest: Effort normal and breath sounds normal. No respiratory distress. She has no wheezes.  Abdominal: Soft. Bowel sounds are normal. She exhibits no distension. There is no tenderness.  Musculoskeletal: Normal range of motion. She exhibits tenderness. She exhibits no edema.  Mild tenderness in right AC area with palpation   Neurological: She is alert and oriented to person, place, and time. She has normal reflexes. No cranial  nerve deficit.  Skin: Skin is warm and dry.  Psychiatric: She has a normal mood and affect. Her behavior is normal. Judgment and thought content normal.  Vitals reviewed.   BP 111/71 mmHg  Pulse 68  Temp(Src) 98.1 F (36.7 C) (Oral)  Ht 5\' 2"  (1.575 m)  Wt 138 lb 6.4 oz (62.778 kg)  BMI 25.31 kg/m2       Assessment & Plan:  1. Right arm pain - gabapentin (NEURONTIN) 400 MG capsule; Take 1 capsule (400 mg total) by mouth 3 (three) times daily.  Dispense: 90 capsule; Refill: 3 - Ambulatory referral to Hand Surgery  2. Nerve pain - gabapentin (NEURONTIN) 400 MG capsule; Take 1 capsule (400 mg total) by mouth 3 (three) times daily.  Dispense: 90 capsule; Refill: 3 - Ambulatory referral to Hand Surgery  Gabapentin increased to 400 mg TID from 300 mg TID Referral to hand specialists to rule out nerve damage? RTO prn  Evelina Dun, FNP

## 2016-02-20 ENCOUNTER — Ambulatory Visit: Payer: BLUE CROSS/BLUE SHIELD | Admitting: Nurse Practitioner

## 2016-02-23 ENCOUNTER — Encounter: Payer: Self-pay | Admitting: Nurse Practitioner

## 2016-02-23 ENCOUNTER — Ambulatory Visit (INDEPENDENT_AMBULATORY_CARE_PROVIDER_SITE_OTHER): Payer: BLUE CROSS/BLUE SHIELD | Admitting: Nurse Practitioner

## 2016-02-23 VITALS — BP 108/70 | Temp 98.5°F | Ht 63.0 in | Wt 137.0 lb

## 2016-02-23 DIAGNOSIS — N6011 Diffuse cystic mastopathy of right breast: Secondary | ICD-10-CM | POA: Insufficient documentation

## 2016-02-23 DIAGNOSIS — N6012 Diffuse cystic mastopathy of left breast: Secondary | ICD-10-CM

## 2016-02-23 HISTORY — DX: Diffuse cystic mastopathy of right breast: N60.11

## 2016-02-23 MED ORDER — NORETHINDRONE 0.35 MG PO TABS: 1.0000 | ORAL_TABLET | Freq: Every day | ORAL | Status: DC

## 2016-02-23 NOTE — Progress Notes (Signed)
Subjective:  Presents with her mother for complaints of bilateral breast tenderness and nodularity. Began about 6-8 months ago, occurs off and on. Mainly in the mornings. Had her last menstrual cycle about 2 weeks ago, regular with normal flow. Drinks a large amount of caffeine. No family history of breast cancer. 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  Objective:   BP 108/70 mmHg  Temp(Src) 98.5 F (36.9 C) (Oral)  Ht 5\' 3"  (1.6 m)  Wt 137 lb (62.143 kg)  BMI 24.27 kg/m2 NAD. Alert, oriented. Lungs clear. Heart regular rhythm. Breast exam. Dense tissue noted bilaterally with multiple fine nodularity, mostly in the upper outer quadrant, no dominant masses. Axilla no adenopathy. Diffuse tenderness.  Assessment:  Problem List Items Addressed This Visit      Other   Fibrocystic breast changes, bilateral - Primary     Plan:  Meds ordered this encounter  Medications  . norethindrone (MICRONOR,CAMILA,ERRIN) 0.35 MG tablet    Sig: Take 1 tablet (0.35 mg total) by mouth daily.    Dispense:  1 Package    Refill:  11    Order Specific Question:  Supervising Provider    Answer:  Mikey Kirschner [[id number]]   Cold/warm compresses with anti-inflammatories as directed. Explained the tenderness is most likely due to midcycle ovulation today. Patient wishes to restart her Micronor to try to help her breast tenderness as well as help her cycles. Start first Sunday after her next normal menses begins. Slowly wean back on caffeine. Call back if any further problems.

## 2016-04-17 ENCOUNTER — Encounter: Payer: Self-pay | Admitting: Family Medicine

## 2016-04-17 ENCOUNTER — Ambulatory Visit (INDEPENDENT_AMBULATORY_CARE_PROVIDER_SITE_OTHER): Payer: BLUE CROSS/BLUE SHIELD | Admitting: Family Medicine

## 2016-04-17 VITALS — BP 110/74 | Temp 98.4°F | Ht 64.0 in | Wt 137.0 lb

## 2016-04-17 DIAGNOSIS — N3000 Acute cystitis without hematuria: Secondary | ICD-10-CM | POA: Diagnosis not present

## 2016-04-17 DIAGNOSIS — M545 Low back pain, unspecified: Secondary | ICD-10-CM

## 2016-04-17 DIAGNOSIS — R309 Painful micturition, unspecified: Secondary | ICD-10-CM | POA: Diagnosis not present

## 2016-04-17 LAB — POCT URINALYSIS DIPSTICK
Leukocytes, UA: NEGATIVE
SPEC GRAV UA: 1.025
pH, UA: 5

## 2016-04-17 MED ORDER — NAPROXEN 500 MG PO TABS: 500.0000 mg | ORAL_TABLET | Freq: Two times a day (BID) | ORAL | Status: DC

## 2016-04-17 MED ORDER — CIPROFLOXACIN HCL 500 MG PO TABS: 500.0000 mg | ORAL_TABLET | Freq: Two times a day (BID) | ORAL | Status: DC

## 2016-04-17 NOTE — Progress Notes (Signed)
   Subjective:    Patient ID: Rebecca Brown, female    DOB: 03-17-1996, 20 y.o.   MRN: [id number]  Dysuria  This is a new problem. Episode onset: 2 weeks ago. Associated symptoms include flank pain, frequency and urgency. Associated symptoms comments: Low abd pain. Treatments tried: azo. The treatment provided no relief.    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   Low back and low abdomen  Last nmenses two weeks ago  And within normal limits  Review of Systems  Genitourinary: Positive for dysuria, urgency, frequency and flank pain.    no fever no headache    Objective:   Physical Exam   alert vitals stable HEENT normal lungs clear heart regular in rhythm no true CVA tenderness positive low back tenderness to palpation positive pain with rotation   urinalysis 4-8 white blood cells per high-power field    Assessment & Plan:   impression 1 urinary tract infection #2 muscle skeletal back pain discussed plan Cipro twice a day 7 days. Naprosyn twice a day when necessary for pain symptom care discussed WSL

## 2016-05-03 ENCOUNTER — Encounter: Payer: Self-pay | Admitting: Nurse Practitioner

## 2016-05-03 ENCOUNTER — Ambulatory Visit (INDEPENDENT_AMBULATORY_CARE_PROVIDER_SITE_OTHER): Payer: BLUE CROSS/BLUE SHIELD | Admitting: Nurse Practitioner

## 2016-05-03 ENCOUNTER — Other Ambulatory Visit (HOSPITAL_COMMUNITY)
Admission: RE | Admit: 2016-05-03 | Discharge: 2016-05-03 | Disposition: A | Discharge status: Home / Self Care | Payer: Self-pay | Source: Ambulatory Visit | Attending: Nurse Practitioner | Admitting: Nurse Practitioner

## 2016-05-03 VITALS — BP 102/68 | Temp 97.8°F | Ht 64.0 in | Wt 138.1 lb

## 2016-05-03 DIAGNOSIS — K5900 Constipation, unspecified: Secondary | ICD-10-CM

## 2016-05-03 DIAGNOSIS — K219 Gastro-esophageal reflux disease without esophagitis: Secondary | ICD-10-CM | POA: Insufficient documentation

## 2016-05-03 DIAGNOSIS — R1030 Lower abdominal pain, unspecified: Secondary | ICD-10-CM

## 2016-05-03 LAB — CBC WITH DIFFERENTIAL/PLATELET
Basophils Absolute: 0 10*3/uL (ref 0.0–0.1)
Basophils Relative: 0 %
Eosinophils Absolute: 0.1 10*3/uL (ref 0.0–0.7)
Eosinophils Relative: 1 %
HEMATOCRIT: 37.7 % (ref 36.0–46.0)
HEMOGLOBIN: 13.1 g/dL (ref 12.0–15.0)
Lymphocytes Relative: 33 %
Lymphs Abs: 2.7 10*3/uL (ref 0.7–4.0)
MCH: 29.6 pg (ref 26.0–34.0)
MCHC: 34.7 g/dL (ref 30.0–36.0)
MCV: 85.3 fL (ref 78.0–100.0)
MONOS PCT: 10 %
Monocytes Absolute: 0.8 10*3/uL (ref 0.1–1.0)
NEUTROS ABS: 4.4 10*3/uL (ref 1.7–7.7)
NEUTROS PCT: 55 %
Platelets: 349 10*3/uL (ref 150–400)
RBC: 4.42 MIL/uL (ref 3.87–5.11)
RDW: 12.5 % (ref 11.5–15.5)
WBC: 8.1 10*3/uL (ref 4.0–10.5)

## 2016-05-03 MED ORDER — RANITIDINE HCL 300 MG PO TABS: 300.0000 mg | ORAL_TABLET | Freq: Every day | ORAL | Status: DC

## 2016-05-03 NOTE — Progress Notes (Signed)
Subjective:  Presents for follow-up after being seen at [facility] ED recently for lower abdominal pain. Started about 2 weeks ago. Usually occurs about midday. Start in the right lower abdomen and goes to the left side then resolves. After this will have low back pain. Describes as occasional intense sharp pain then uncomfortable. Is in a lesbian relationship. No sexual contact with a female in over year. No vaginal discharge. Currently on Micronor, denies any missed doses. Will have occasional light spotting. Normally has a BM 2-3 times per day. Over the past 2 weeks has had small BMs and constipation. No blood in her stool. No change in color. No nausea vomiting. No fevers. Has daily acid reflux symptoms. Drinks a large amount of caffeine and eats chocolate daily. No tobacco alcohol or excessive NSAID use. Last seen in our office on 5/30 for urinary symptoms and low back pain. Urinary symptoms have resolved. Has a paper with her today from ED, urine was clear.  Objective:   BP 102/68 mmHg  Temp(Src) 97.8 F (36.6 C) (Oral)  Ht 5\' 4"  (1.626 m)  Wt 138 lb 2 oz (62.653 kg)  BMI 23.70 kg/m2 NAD. Alert, oriented. Lungs clear. Heart regular rate rhythm. Abdomen soft nondistended with hypoactive bowel sounds 4. Distinct epigastric area tenderness noted. Generalized lower abdominal tenderness with guarding noted particularly around the mid lower abdominal area. No obvious masses.  Assessment: Lower abdominal pain - Plan: CBC with Differential/Platelet, DG Abd 1 View  Gastroesophageal reflux disease without esophagitis - Plan: CBC with Differential/Platelet  Constipation, unspecified constipation type - Plan: CBC with Differential/Platelet, DG Abd 1 View  Plan:  Meds ordered this encounter  Medications  . ranitidine (ZANTAC) 300 MG tablet    Sig: Take 1 tablet (300 mg total) by mouth at bedtime.    Dispense:  30 tablet    Refill:  5    Order Specific Question:  Supervising Provider    Answer:   Mikey Kirschner [[id number]]   Stat CBC and x-ray of the abdomen to assess for constipation. Further follow-up based on results. Start Zantac daily as directed. Given written and verbal information on reflux disease. Slowly wean off caffeine.

## 2016-05-03 NOTE — Patient Instructions (Addendum)
benefiber Miralax Food Choices for Gastroesophageal Reflux Disease, Adult When you have gastroesophageal reflux disease (GERD), the foods you eat and your eating habits are very important. Choosing the right foods can help ease the discomfort of GERD. WHAT GENERAL GUIDELINES DO I NEED TO FOLLOW?  Choose fruits, vegetables, whole grains, low-fat dairy products, and low-fat meat, fish, and poultry.  Limit fats such as oils, salad dressings, butter, nuts, and avocado.  Keep a food diary to identify foods that cause symptoms.  Avoid foods that cause reflux. These may be different for different people.  Eat frequent small meals instead of three large meals each day.  Eat your meals slowly, in a relaxed setting.  Limit fried foods.  Cook foods using methods other than frying.  Avoid drinking alcohol.  Avoid drinking large amounts of liquids with your meals.  Avoid bending over or lying down until 2-3 hours after eating. WHAT FOODS ARE NOT RECOMMENDED? The following are some foods and drinks that may worsen your symptoms: Vegetables Tomatoes. Tomato juice. Tomato and spaghetti sauce. Chili peppers. Onion and garlic. Horseradish. Fruits Oranges, grapefruit, and lemon (fruit and juice). Meats High-fat meats, fish, and poultry. This includes hot dogs, ribs, ham, sausage, salami, and bacon. Dairy Whole milk and chocolate milk. Sour cream. Cream. Butter. Ice cream. Cream cheese.  Beverages Coffee and tea, with or without caffeine. Carbonated beverages or energy drinks. Condiments Hot sauce. Barbecue sauce.  Sweets/Desserts Chocolate and cocoa. Donuts. Peppermint and spearmint. Fats and Oils High-fat foods, including [place] fries and potato chips. Other Vinegar. Strong spices, such as black pepper, white pepper, red pepper, cayenne, curry powder, cloves, ginger, and chili powder. The items listed above may not be a complete list of foods and beverages to avoid. Contact your  dietitian for more information.   This information is not intended to replace advice given to you by your health care provider. Make sure you discuss any questions you have with your health care provider.   Document Released: 11/05/2005 Document Revised: 11/26/2014 Document Reviewed: 09/09/2013 Elsevier Interactive Patient Education [employer].

## 2016-05-04 ENCOUNTER — Telehealth: Payer: Self-pay | Admitting: *Deleted

## 2016-05-04 DIAGNOSIS — R109 Unspecified abdominal pain: Secondary | ICD-10-CM

## 2016-05-04 NOTE — Telephone Encounter (Signed)
I recommend pelvic US to assess ovaries followed by an office visit. Call back sooner or go to ED if worse.

## 2016-05-04 NOTE — Telephone Encounter (Signed)
Pt did not have xray yesterday because she did not know she was suppose to go to radiology. She only had lab order and lab told her she did not have an order for an xray. Order was put in. Pt states her bowels are doing better.

## 2016-05-04 NOTE — Telephone Encounter (Signed)
Order for Pelvic Ultrasound in Epic. Left message to see when patient can go for ultrasound.

## 2016-05-08 NOTE — Telephone Encounter (Signed)
Left message to return call 

## 2016-05-08 NOTE — Telephone Encounter (Signed)
[place] scheduled for 05/11/16 at 4 pm. Patient was notified.

## 2016-05-11 ENCOUNTER — Ambulatory Visit (HOSPITAL_COMMUNITY): Admission: RE | Admit: 2016-05-11 | Payer: Self-pay | Source: Ambulatory Visit

## 2016-05-25 ENCOUNTER — Ambulatory Visit (HOSPITAL_COMMUNITY): Admission: RE | Admit: 2016-05-25 | Payer: BLUE CROSS/BLUE SHIELD | Source: Ambulatory Visit

## 2016-09-06 ENCOUNTER — Telehealth: Payer: Self-pay | Admitting: Family Medicine

## 2016-09-06 NOTE — Telephone Encounter (Signed)
She does not have to be seen to restart her birth control pills. It is best to start birth control pills on the Sunday after menstruation. In addition to this it is important for the patient relies that earth control pills are not effective for preventing pregnancy until the second pack-she may have a prescription with 5 refills-she ought to follow-up for female health checkup with Rebecca Brown within the next 6 months sooner if any problems

## 2016-09-06 NOTE — Telephone Encounter (Signed)
Pt's been off her birth control pills since April, will she need to be seen to restart them??  Please advise   [facility]

## 2016-09-07 MED ORDER — NORETHINDRONE 0.35 MG PO TABS: 1.0000 | ORAL_TABLET | Freq: Every day | ORAL | 5 refills | Status: DC

## 2016-09-07 NOTE — Telephone Encounter (Signed)
Tried to call number in chart does not work. (medication sent into pharmacy)

## 2016-09-25 ENCOUNTER — Ambulatory Visit (INDEPENDENT_AMBULATORY_CARE_PROVIDER_SITE_OTHER): Payer: BLUE CROSS/BLUE SHIELD | Admitting: Family Medicine

## 2016-09-25 ENCOUNTER — Encounter: Payer: Self-pay | Admitting: Family Medicine

## 2016-09-25 VITALS — BP 110/68 | Temp 98.2°F | Ht 64.0 in | Wt 142.4 lb

## 2016-09-25 DIAGNOSIS — B373 Candidiasis of vulva and vagina: Secondary | ICD-10-CM | POA: Diagnosis not present

## 2016-09-25 DIAGNOSIS — R42 Dizziness and giddiness: Secondary | ICD-10-CM

## 2016-09-25 DIAGNOSIS — B3731 Acute candidiasis of vulva and vagina: Secondary | ICD-10-CM

## 2016-09-25 DIAGNOSIS — B9689 Other specified bacterial agents as the cause of diseases classified elsewhere: Secondary | ICD-10-CM

## 2016-09-25 DIAGNOSIS — R5383 Other fatigue: Secondary | ICD-10-CM

## 2016-09-25 DIAGNOSIS — J019 Acute sinusitis, unspecified: Secondary | ICD-10-CM | POA: Diagnosis not present

## 2016-09-25 MED ORDER — AMOXICILLIN 500 MG PO TABS: 500.0000 mg | ORAL_TABLET | Freq: Three times a day (TID) | ORAL | 0 refills | Status: DC

## 2016-09-25 MED ORDER — FLUCONAZOLE 150 MG PO TABS: ORAL_TABLET | ORAL | 3 refills | Status: DC

## 2016-09-25 NOTE — Progress Notes (Signed)
   Subjective:    Patient ID: Rebecca Brown, female    DOB: 1995-12-01, 20 y.o.   MRN: [id number]  HPI Cough and headache and feels shaky at times. Patient arrives with c/o dizziness, fatigue and not sleeping well for a month. Patient also with  The patient also relates intermittent yeast vaginitis. She does relate head congestion sinus pressure drainage coughing denies wheezing difficulty breathing. Patient under a fair amount of stress having to work a lot of hours with her job. Denies being depressed. Review of Systems Positive cough runny nose fatigue tiredness intermittent dizziness relates a lot of stress denies depression    Objective:   Physical Exam Nares normal ears normal throat normal neck supple lungs clear heart regular       Assessment & Plan:  Fatigue tiredness check lab work to make sure there is not a derangement going on Possible sinus infection amoxicillin 10 days Intermittent vaginal yeast infections medications prescribed Hopefully patient job will slow down to give her occasional days off. Follow-up if ongoing troubles

## 2016-09-26 LAB — BASIC METABOLIC PANEL
BUN / CREAT RATIO: 15 (ref 9–23)
BUN: 11 mg/dL (ref 6–20)
CALCIUM: 9.6 mg/dL (ref 8.7–10.2)
CO2: 24 mmol/L (ref 18–29)
CREATININE: 0.75 mg/dL (ref 0.57–1.00)
Chloride: 100 mmol/L (ref 96–106)
GFR, EST AFRICAN AMERICAN: 131 mL/min/{1.73_m2} (ref >59)
GFR, EST NON AFRICAN AMERICAN: 114 mL/min/{1.73_m2} (ref >59)
Glucose: 90 mg/dL (ref 65–99)
Potassium: 4.6 mmol/L (ref 3.5–5.2)
Sodium: 140 mmol/L (ref 134–144)

## 2016-09-26 LAB — CBC WITH DIFFERENTIAL/PLATELET
BASOS: 1 %
Basophils Absolute: 0 10*3/uL (ref 0.0–0.2)
EOS (ABSOLUTE): 0.1 10*3/uL (ref 0.0–0.4)
EOS: 1 %
HEMATOCRIT: 39.5 % (ref 34.0–46.6)
Hemoglobin: 13.6 g/dL (ref 11.1–15.9)
IMMATURE GRANS (ABS): 0 10*3/uL (ref 0.0–0.1)
IMMATURE GRANULOCYTES: 0 %
LYMPHS: 33 %
Lymphocytes Absolute: 2.7 10*3/uL (ref 0.7–3.1)
MCH: 29.1 pg (ref 26.6–33.0)
MCHC: 34.4 g/dL (ref 31.5–35.7)
MCV: 84 fL (ref 79–97)
Monocytes Absolute: 0.8 10*3/uL (ref 0.1–0.9)
Monocytes: 9 %
NEUTROS ABS: 4.7 10*3/uL (ref 1.4–7.0)
NEUTROS PCT: 56 %
PLATELETS: 332 10*3/uL (ref 150–379)
RBC: 4.68 x10E6/uL (ref 3.77–5.28)
RDW: 12.7 % (ref 12.3–15.4)
WBC: 8.4 10*3/uL (ref 3.4–10.8)

## 2016-09-26 LAB — TSH: TSH: 2.53 u[IU]/mL (ref 0.450–4.500)

## 2016-10-22 ENCOUNTER — Telehealth: Payer: Self-pay | Admitting: Family Medicine

## 2016-10-22 NOTE — Telephone Encounter (Signed)
ERROR

## 2016-10-31 ENCOUNTER — Ambulatory Visit: Payer: BLUE CROSS/BLUE SHIELD | Admitting: Nurse Practitioner

## 2016-10-31 DIAGNOSIS — Z0289 Encounter for other administrative examinations: Secondary | ICD-10-CM

## 2016-12-03 ENCOUNTER — Encounter: Payer: Self-pay | Admitting: Nurse Practitioner

## 2016-12-03 ENCOUNTER — Ambulatory Visit (INDEPENDENT_AMBULATORY_CARE_PROVIDER_SITE_OTHER): Payer: Self-pay | Admitting: Nurse Practitioner

## 2016-12-03 ENCOUNTER — Encounter: Payer: Self-pay | Admitting: Family Medicine

## 2016-12-03 VITALS — BP 110/80 | Temp 98.6°F | Ht 64.0 in | Wt 147.5 lb

## 2016-12-03 DIAGNOSIS — J029 Acute pharyngitis, unspecified: Secondary | ICD-10-CM

## 2016-12-03 LAB — POCT RAPID STREP A (OFFICE): RAPID STREP A SCREEN: NEGATIVE

## 2016-12-03 MED ORDER — NORETHINDRONE 0.35 MG PO TABS: 1.0000 | ORAL_TABLET | Freq: Every day | ORAL | 5 refills | Status: DC

## 2016-12-03 MED ORDER — TERCONAZOLE 0.8 % VA CREA: 1.0000 | TOPICAL_CREAM | Freq: Every day | VAGINAL | 0 refills | Status: DC

## 2016-12-03 NOTE — Patient Instructions (Signed)
Acidophilous

## 2016-12-04 ENCOUNTER — Encounter: Payer: Self-pay | Admitting: Nurse Practitioner

## 2016-12-04 ENCOUNTER — Telehealth: Payer: Self-pay | Admitting: Nurse Practitioner

## 2016-12-04 LAB — STREP A DNA PROBE: Strep Gp A Direct, DNA Probe: NEGATIVE

## 2016-12-04 LAB — PLEASE NOTE

## 2016-12-04 NOTE — Progress Notes (Signed)
Subjective:  Presents for c/o sore throat and headache for the past 3 days. Probable fever with chills and feeling hot at times. Runny nose. Slight cough. Bilateral ear pain. No wheezing. Taking fluids well. Voiding nl. Also requesting med for vaginal yeast infection. Will clear up briefly with Monistat but come back with her cycle. Stopped oc's to see if this would help but still has recurrent vaginal yeast infections.   Objective:   BP 110/80   Temp 98.6 F (37 C) (Oral)   Ht 5\' 4"  (1.626 m)   Wt 147 lb 8 oz (66.9 kg)   BMI 25.32 kg/m  NAD. Alert, oriented. TMs retracted, no erythema. Pharynx mild erythema with one tiny white patch left tonsil. Otherwise clear. RST neg. Neck supple with mild anterior adenopathy. Lungs clear. Heart RRR. Defers pelvic exam.   Assessment:  Sore throat - Plan: POCT rapid strep A, Strep A DNA probe   Plan:  Meds ordered this encounter  Medications  . terconazole (TERAZOL 3) 0.8 % vaginal cream    Sig: Place 1 applicator vaginally at bedtime.    Dispense:  20 g    Refill:  0    Order Specific Question:   Supervising Provider    Answer:   Mikey Kirschner [[id number]]  . norethindrone (MICRONOR,CAMILA,ERRIN) 0.35 MG tablet    Sig: Take 1 tablet (0.35 mg total) by mouth daily.    Dispense:  1 Package    Refill:  5    Order Specific Question:   Supervising Provider    Answer:   Mikey Kirschner [[id number]]   Patient wishes to restart oc's. Start Acidophilus daily. Throat culture pending. Warning signs reviewed. Call back by the end of the week if no improvement, sooner if worse.

## 2016-12-04 NOTE — Telephone Encounter (Signed)
See mychart messages

## 2016-12-18 ENCOUNTER — Encounter: Payer: Self-pay | Admitting: Family Medicine

## 2017-01-11 ENCOUNTER — Ambulatory Visit: Payer: Self-pay | Admitting: Nurse Practitioner

## 2017-01-18 ENCOUNTER — Encounter: Payer: Self-pay | Admitting: Nurse Practitioner

## 2017-01-18 ENCOUNTER — Ambulatory Visit (INDEPENDENT_AMBULATORY_CARE_PROVIDER_SITE_OTHER): Payer: Self-pay | Admitting: Nurse Practitioner

## 2017-01-18 VITALS — Temp 98.5°F | Ht 64.0 in | Wt 151.4 lb

## 2017-01-18 DIAGNOSIS — N760 Acute vaginitis: Secondary | ICD-10-CM

## 2017-01-18 DIAGNOSIS — Z3009 Encounter for other general counseling and advice on contraception: Secondary | ICD-10-CM

## 2017-01-18 DIAGNOSIS — B9689 Other specified bacterial agents as the cause of diseases classified elsewhere: Secondary | ICD-10-CM

## 2017-01-18 MED ORDER — METRONIDAZOLE 500 MG PO TABS: 500.0000 mg | ORAL_TABLET | Freq: Two times a day (BID) | ORAL | 0 refills | Status: DC

## 2017-01-18 NOTE — Patient Instructions (Addendum)
Depo Provera Nexplanon Kylessa    Bacterial Vaginosis Bacterial vaginosis is a vaginal infection that occurs when the normal balance of bacteria in the vagina is disrupted. It results from an overgrowth of certain bacteria. This is the most common vaginal infection among women ages 21-44. Because bacterial vaginosis increases your risk for STIs (sexually transmitted infections), getting treated can help reduce your risk for chlamydia, gonorrhea, herpes, and HIV (human immunodeficiency virus). Treatment is also important for preventing complications in pregnant women, because this condition can cause an early (premature) delivery. What are the causes? This condition is caused by an increase in harmful bacteria that are normally present in small amounts in the vagina. However, the reason that the condition develops is not fully understood. What increases the risk? The following factors may make you more likely to develop this condition:  Having a new sexual partner or multiple sexual partners.  Having unprotected sex.  Douching.  Having an intrauterine device (IUD).  Smoking.  Drug and alcohol abuse.  Taking certain antibiotic medicines.  Being pregnant. You cannot get bacterial vaginosis from toilet seats, bedding, swimming pools, or contact with objects around you. What are the signs or symptoms? Symptoms of this condition include:  Grey or white vaginal discharge. The discharge can also be watery or foamy.  A fish-like odor with discharge, especially after sexual intercourse or during menstruation.  Itching in and around the vagina.  Burning or pain with urination. Some women with bacterial vaginosis have no signs or symptoms. How is this diagnosed? This condition is diagnosed based on:  Your medical history.  A physical exam of the vagina.  Testing a sample of vaginal fluid under a microscope to look for a large amount of bad bacteria or abnormal cells. Your health  care provider may use a cotton swab or a small wooden spatula to collect the sample. How is this treated? This condition is treated with antibiotics. These may be given as a pill, a vaginal cream, or a medicine that is put into the vagina (suppository). If the condition comes back after treatment, a second round of antibiotics may be needed. Follow these instructions at home: Medicines   Take over-the-counter and prescription medicines only as told by your health care provider.  Take or use your antibiotic as told by your health care provider. Do not stop taking or using the antibiotic even if you start to feel better. General instructions   If you have a female sexual partner, tell her that you have a vaginal infection. She should see her health care provider and be treated if she has symptoms. If you have a female sexual partner, he does not need treatment.  During treatment:  Avoid sexual activity until you finish treatment.  Do not douche.  Avoid alcohol as directed by your health care provider.  Avoid breastfeeding as directed by your health care provider.  Drink enough water and fluids to keep your urine clear or pale yellow.  Keep the area around your vagina and rectum clean.  Wash the area daily with warm water.  Wipe yourself from front to back after using the toilet.  Keep all follow-up visits as told by your health care provider. This is important. How is this prevented?  Do not douche.  Wash the outside of your vagina with warm water only.  Use protection when having sex. This includes latex condoms and dental dams.  Limit how many sexual partners you have. To help prevent bacterial vaginosis, it is  best to have sex with just one partner (monogamous).  Make sure you and your sexual partner are tested for STIs.  Wear cotton or cotton-lined underwear.  Avoid wearing tight pants and pantyhose, especially during summer.  Limit the amount of alcohol that you  drink.  Do not use any products that contain nicotine or tobacco, such as cigarettes and e-cigarettes. If you need help quitting, ask your health care provider.  Do not use illegal drugs. Where to find more information:  Centers for Disease Control and Prevention: AppraiserFraud.fi  American Sexual Health Association (ASHA): www.ashastd.org  U.S. Department of Health and Financial controller, Office on Women's Health: DustingSprays.pl or SecuritiesCard.it Contact a health care provider if:  Your symptoms do not improve, even after treatment.  You have more discharge or pain when urinating.  You have a fever.  You have pain in your abdomen.  You have pain during sex.  You have vaginal bleeding between periods. Summary  Bacterial vaginosis is a vaginal infection that occurs when the normal balance of bacteria in the vagina is disrupted.  Because bacterial vaginosis increases your risk for STIs (sexually transmitted infections), getting treated can help reduce your risk for chlamydia, gonorrhea, herpes, and HIV (human immunodeficiency virus). Treatment is also important for preventing complications in pregnant women, because the condition can cause an early (premature) delivery.  This condition is treated with antibiotic medicines. These may be given as a pill, a vaginal cream, or a medicine that is put into the vagina (suppository). This information is not intended to replace advice given to you by your health care provider. Make sure you discuss any questions you have with your health care provider. Document Released: 11/05/2005 Document Revised: 07/21/2016 Document Reviewed: 07/21/2016 Elsevier Interactive Patient Education  2017 [employer].

## 2017-01-19 ENCOUNTER — Encounter: Payer: Self-pay | Admitting: Nurse Practitioner

## 2017-01-19 LAB — POCT WET PREP WITH KOH
KOH PREP POC: NEGATIVE
RBC Wet Prep HPF POC: NEGATIVE
TRICHOMONAS UA: NEGATIVE
YEAST WET PREP PER HPF POC: NEGATIVE
pH, Wet Prep: 5

## 2017-01-19 NOTE — Progress Notes (Signed)
Subjective:  Presents for c/o persistent vaginal discharge after 2 treatments for yeast infection. Has a white discharge with slight odor. Mild itching. No dyspareunia. Same partner x 2 years. Occasional pelvic pain. Having heavy bleeding during her cycle on her current oc. Interested in other methods. No urinary symptoms. No fever.   Objective:   Temp 98.5 F (36.9 C) (Oral)   Ht 5\' 4"  (1.626 m)   Wt 151 lb 6.4 oz (68.7 kg)   BMI 25.99 kg/m  NAD. Alert, oriented. Lungs clear. Heart RRR. Abdomen soft, non tender. External GU: no rashes or lesions. Vagina: minimal amount of white mucoid discharge. Cervix normal in appearance; no CMT. Bimanual exam: minimal tenderness; no obvious masses.  Results for orders placed or performed in visit on 01/18/17  POCT Wet Prep with KOH  Result Value Ref Range   Trichomonas, UA Negative    Clue Cells Wet Prep HPF POC moderate    Epithelial Wet Prep HPF POC Moderate Few, Moderate, Many, Too numerous to count   Yeast Wet Prep HPF POC neg    Bacteria Wet Prep HPF POC Moderate (A) Few   RBC Wet Prep HPF POC neg    WBC Wet Prep HPF POC occas    KOH Prep POC Negative Negative   pH, Wet Prep 5.0       Assessment:  Bacterial vaginosis - Plan: POCT Wet Prep with KOH  Encounter for general counseling and advice on contraceptive management    Plan:   Meds ordered this encounter  Medications  . metroNIDAZOLE (FLAGYL) 500 MG tablet    Sig: Take 1 tablet (500 mg total) by mouth 2 (two) times daily with a meal.    Dispense:  14 tablet    Refill:  0    Order Specific Question:   Supervising Provider    Answer:   Mikey Kirschner [[id number]]   Reviewed safe sex issues. Defers STD screening at this time.  Discussed contraceptive options; call back if she wants to switch methods.  Warning signs reviewed. Return if symptoms worsen or fail to improve. Recommend physical.

## 2017-01-27 ENCOUNTER — Encounter: Payer: Self-pay | Admitting: Nurse Practitioner

## 2017-02-07 ENCOUNTER — Ambulatory Visit: Payer: Self-pay | Admitting: Nurse Practitioner

## 2017-03-20 ENCOUNTER — Encounter: Payer: Self-pay | Admitting: Nurse Practitioner

## 2017-05-01 ENCOUNTER — Ambulatory Visit: Payer: Self-pay | Admitting: Nurse Practitioner

## 2017-05-08 ENCOUNTER — Encounter: Payer: Self-pay | Admitting: Nurse Practitioner

## 2017-05-08 ENCOUNTER — Ambulatory Visit (INDEPENDENT_AMBULATORY_CARE_PROVIDER_SITE_OTHER): Payer: BLUE CROSS/BLUE SHIELD | Admitting: Nurse Practitioner

## 2017-05-08 VITALS — BP 110/72 | Temp 98.4°F | Ht 64.0 in | Wt 151.0 lb

## 2017-05-08 DIAGNOSIS — K582 Mixed irritable bowel syndrome: Secondary | ICD-10-CM | POA: Diagnosis not present

## 2017-05-08 DIAGNOSIS — R300 Dysuria: Secondary | ICD-10-CM | POA: Diagnosis not present

## 2017-05-08 DIAGNOSIS — R1084 Generalized abdominal pain: Secondary | ICD-10-CM | POA: Diagnosis not present

## 2017-05-08 DIAGNOSIS — K2900 Acute gastritis without bleeding: Secondary | ICD-10-CM | POA: Diagnosis not present

## 2017-05-08 LAB — POCT URINALYSIS DIPSTICK: pH, UA: 5 (ref 5.0–8.0)

## 2017-05-08 LAB — POCT URINE PREGNANCY: PREG TEST UR: NEGATIVE

## 2017-05-08 LAB — POCT HEMOGLOBIN: HEMOGLOBIN: 14.5 g/dL (ref 12.2–16.2)

## 2017-05-08 NOTE — Patient Instructions (Addendum)
Irritable Bowel Syndrome, Adult Irritable bowel syndrome (IBS) is not one specific disease. It is a group of symptoms that affects the organs responsible for digestion (gastrointestinal or GI tract). To regulate how your GI tract works, your body sends signals back and forth between your intestines and your brain. If you have IBS, there may be a problem with these signals. As a result, your GI tract does not function normally. Your intestines may become more sensitive and overreact to certain things. This is especially true when you eat certain foods or when you are under stress. There are four types of IBS. These may be determined based on the consistency of your stool:  IBS with diarrhea.  IBS with constipation.  Mixed IBS.  Unsubtyped IBS.  It is important to know which type of IBS you have. Some treatments are more likely to be helpful for certain types of IBS. What are the causes? The exact cause of IBS is not known. What increases the risk? You may have a higher risk of IBS if:  You are a woman.  You are younger than 23 years old.  You have a family history of IBS.  You have mental health problems.  You have had bacterial infection of your GI tract.  What are the signs or symptoms? Symptoms of IBS vary from person to person. The main symptom is abdominal pain or discomfort. Additional symptoms usually include one or more of the following:  Diarrhea, constipation, or both.  Abdominal swelling or bloating.  Feeling full or sick after eating a small or regular-size meal.  Frequent gas.  Mucus in the stool.  A feeling of having more stool left after a bowel movement.  Symptoms tend to come and go. They may be associated with stress, psychiatric conditions, or nothing at all. How is this diagnosed? There is no specific test to diagnose IBS. Your health care provider will make a diagnosis based on a physical exam, medical history, and your symptoms. You may have other  tests to rule out other conditions that may be causing your symptoms. These may include:  Blood tests.  X-rays.  CT scan.  Endoscopy and colonoscopy. This is a test in which your GI tract is viewed with a long, thin, flexible tube.  How is this treated? There is no cure for IBS, but treatment can help relieve symptoms. IBS treatment often includes:  Changes to your diet, such as: ? Eating more fiber. ? Avoiding foods that cause symptoms. ? Drinking more water. ? Eating regular, medium-sized portioned meals.  Medicines. These may include: ? Fiber supplements if you have constipation. ? Medicine to control diarrhea (antidiarrheal medicines). ? Medicine to help control muscle spasms in your GI tract (antispasmodic medicines). ? Medicines to help with any mental health issues, such as antidepressants or tranquilizers.  Therapy. ? Talk therapy may help with anxiety, depression, or other mental health issues that can make IBS symptoms worse.  Stress reduction. ? Managing your stress can help keep symptoms under control.  Follow these instructions at home:  Take medicines only as directed by your health care provider.  Eat a healthy diet. ? Avoid foods and drinks with added sugar. ? Include more whole grains, fruits, and vegetables gradually into your diet. This may be especially helpful if you have IBS with constipation. ? Avoid any foods and drinks that make your symptoms worse. These may include dairy products and caffeinated or carbonated drinks. ? Do not eat large meals. ? Drink enough   fluid to keep your urine clear or pale yellow.  Exercise regularly. Ask your health care provider for recommendations of good activities for you.  Keep all follow-up visits as directed by your health care provider. This is important. Contact a health care provider if:  You have constant pain.  You have trouble or pain with swallowing.  You have worsening diarrhea. Get help right away  if:  You have severe and worsening abdominal pain.  You have diarrhea and: ? You have a rash, stiff neck, or severe headache. ? You are irritable, sleepy, or difficult to awaken. ? You are weak, dizzy, or extremely thirsty.  You have bright red blood in your stool or you have black tarry stools.  You have unusual abdominal swelling that is painful.  You vomit continuously.  You vomit blood (hematemesis).  You have both abdominal pain and a fever. This information is not intended to replace advice given to you by your health care provider. Make sure you discuss any questions you have with your health care provider. Document Released: 11/05/2005 Document Revised: 04/06/2016 Document Reviewed: 07/23/2014 Elsevier Interactive Patient Education  2018 [place] for Gastroesophageal Reflux Disease, Adult When you have gastroesophageal reflux disease (GERD), the foods you eat and your eating habits are very important. Choosing the right foods can help ease your discomfort. What guidelines do I need to follow?  Choose fruits, vegetables, whole grains, and low-fat dairy products.  Choose low-fat meat, fish, and poultry.  Limit fats such as oils, salad dressings, butter, nuts, and avocado.  Keep a food diary. This helps you identify foods that cause symptoms.  Avoid foods that cause symptoms. These may be different for everyone.  Eat small meals often instead of 3 large meals a day.  Eat your meals slowly, in a place where you are relaxed.  Limit fried foods.  Cook foods using methods other than frying.  Avoid drinking alcohol.  Avoid drinking large amounts of liquids with your meals.  Avoid bending over or lying down until 2-3 hours after eating. What foods are not recommended? These are some foods and drinks that may make your symptoms worse: Vegetables Tomatoes. Tomato juice. Tomato and spaghetti sauce. Chili peppers. Onion and garlic.  Horseradish. Fruits Oranges, grapefruit, and lemon (fruit and juice). Meats High-fat meats, fish, and poultry. This includes hot dogs, ribs, ham, sausage, salami, and bacon. Dairy Whole milk and chocolate milk. Sour cream. Cream. Butter. Ice cream. Cream cheese. Drinks Coffee and tea. Bubbly (carbonated) drinks or energy drinks. Condiments Hot sauce. Barbecue sauce. Sweets/Desserts Chocolate and cocoa. Donuts. Peppermint and spearmint. Fats and Oils High-fat foods. This includes [place] fries and potato chips. Other Vinegar. Strong spices. This includes black pepper, white pepper, red pepper, cayenne, curry powder, cloves, ginger, and chili powder. The items listed above may not be a complete list of foods and drinks to avoid. Contact your dietitian for more information. This information is not intended to replace advice given to you by your health care provider. Make sure you discuss any questions you have with your health care provider. Document Released: 05/06/2012 Document Revised: 04/12/2016 Document Reviewed: 09/09/2013 Elsevier Interactive Patient Education  2017 [employer].

## 2017-05-09 ENCOUNTER — Telehealth: Payer: Self-pay | Admitting: Nurse Practitioner

## 2017-05-09 ENCOUNTER — Encounter: Payer: Self-pay | Admitting: Nurse Practitioner

## 2017-05-09 DIAGNOSIS — K589 Irritable bowel syndrome without diarrhea: Secondary | ICD-10-CM | POA: Insufficient documentation

## 2017-05-09 DIAGNOSIS — K2900 Acute gastritis without bleeding: Secondary | ICD-10-CM

## 2017-05-09 DIAGNOSIS — R109 Unspecified abdominal pain: Secondary | ICD-10-CM

## 2017-05-09 DIAGNOSIS — K582 Mixed irritable bowel syndrome: Secondary | ICD-10-CM

## 2017-05-09 DIAGNOSIS — K581 Irritable bowel syndrome with constipation: Secondary | ICD-10-CM | POA: Insufficient documentation

## 2017-05-09 HISTORY — DX: Mixed irritable bowel syndrome: K58.2

## 2017-05-09 HISTORY — DX: Acute gastritis without bleeding: K29.00

## 2017-05-09 NOTE — Telephone Encounter (Signed)
Not STAT per Hoyle Sauer- ok for CT next week

## 2017-05-09 NOTE — Telephone Encounter (Signed)
Received word from front desk that patient is now under her father's insurance. Does she want to proceed with work up or wait a few days to see if Nexium helps?

## 2017-05-09 NOTE — Telephone Encounter (Signed)
Please order Met 7, CBC, liver and lipase. Also see if we can get CT scan of abd and pelvis with contrast. Thanks. See last note for details.

## 2017-05-09 NOTE — Telephone Encounter (Signed)
Spoke with patient and informed her per Linzie Collin- Hoyle Sauer received word from front desk that patient is now under your father's insurance. Do you want to proceed with work up or wait a few days to see if Nexium helps. Patient verbalized understanding and stated that she wants to proceed with work up.

## 2017-05-09 NOTE — Progress Notes (Signed)
Subjective:  Presents for complaints of lower abdominal pain for the past 2 weeks. Describes as a stabbing pain. Will radiate into the mid abdomen. Occasionally radiates into the right flank/CVA area. Occurs about 3-4 times per day every day. Large meals seem to trigger her symptoms. No fevers. Unassociated with any specific foods. Has woken her up at nighttime. Usually has at least 3 hours between her last meal and bedtime. Also seems to be worse if she walks a lot. Better if she lies on her abdomen putting pressure with a pillow. Alternating cycles of diarrhea and constipation. Had one off-white/hand stool 1, otherwise normal color. No bloody or tarry stools. Occasional mild dysuria. Some increased frequency. No hematuria. Had a normal menstrual cycle last week. Has not been with a female partner. Has been with the same female partner for the past 2 years. No vaginal discharge. Regular cycles, heavy flow. Having daily reflux symptoms. Is no longer on Zantac. No nausea vomiting.    Objective:   BP 110/72   Temp 98.4 F (36.9 C)   Ht 5\' 4"  (1.626 m)   Wt 151 lb (68.5 kg)   LMP 04/29/2017 (Approximate)   BMI 25.92 kg/m  NAD. Alert, oriented. Lungs clear. Heart regular rate rhythm. No CVA or flank tenderness. Abdomen soft nondistended with active bowel sounds 4; distinct significant epigastric area tenderness, otherwise generalized abdominal tenderness. Some guarding with deep palpation. No obvious masses.  Results for orders placed or performed in visit on 05/08/17  POCT urinalysis dipstick  Result Value Ref Range   Color, UA     Clarity, UA     Glucose, UA     Bilirubin, UA     Ketones, UA     Spec Grav, UA >=1.030 (A) 1.010 - 1.025   Blood, UA     pH, UA 5.0 5.0 - 8.0   Protein, UA     Urobilinogen, UA  0.2 or 1.0 E.U./dL   Nitrite, UA     Leukocytes, UA  Negative  POCT urine pregnancy  Result Value Ref Range   Preg Test, Ur Negative Negative  POCT hemoglobin  Result Value Ref Range    Hemoglobin 14.5 12.2 - 16.2 g/dL   Urine micro-negative.  Assessment:   Problem List Items Addressed This Visit      Digestive   Acute gastritis without hemorrhage   Relevant Orders   POCT hemoglobin (Completed)   Irritable bowel syndrome with both constipation and diarrhea   Relevant Orders   POCT hemoglobin (Completed)    Other Visit Diagnoses    Generalized abdominal pain    -  Primary   Relevant Orders   POCT urinalysis dipstick (Completed)   POCT urine pregnancy (Completed)   POCT hemoglobin (Completed)       Plan:  Given samples of OTC Nexium take 1-2 per day for reflux. Patient is uninsured at this point. Would like to do further workup but she cannot afford at this time. Will be going under her father's insurance in the near future. Warning signs reviewed including fever, vomiting, increase in abdominal pain. Go to ED immediately if symptoms worsen. Return in about 2 weeks (around 05/22/2017). Call back sooner if needed.  Also let us know if she goes under her father's insurance.

## 2017-05-10 NOTE — Telephone Encounter (Signed)
bloodwork orders put in and ct scan scheduled at [facility]. July 5th 8am register 7:45. Nothing to eat or drink 4 hours prior to test. Pick up contrast before appt. Discussed with pt. Pt verbalized understanding.

## 2017-05-14 LAB — CBC WITH DIFFERENTIAL/PLATELET
BASOS: 1 %
Basophils Absolute: 0 10*3/uL (ref 0.0–0.2)
EOS (ABSOLUTE): 0.1 10*3/uL (ref 0.0–0.4)
EOS: 1 %
HEMATOCRIT: 42.2 % (ref 34.0–46.6)
HEMOGLOBIN: 13.8 g/dL (ref 11.1–15.9)
IMMATURE GRANS (ABS): 0 10*3/uL (ref 0.0–0.1)
IMMATURE GRANULOCYTES: 0 %
LYMPHS: 35 %
Lymphocytes Absolute: 3.1 10*3/uL (ref 0.7–3.1)
MCH: 27.3 pg (ref 26.6–33.0)
MCHC: 32.7 g/dL (ref 31.5–35.7)
MCV: 83 fL (ref 79–97)
MONOCYTES: 10 %
Monocytes Absolute: 0.9 10*3/uL (ref 0.1–0.9)
NEUTROS ABS: 4.7 10*3/uL (ref 1.4–7.0)
NEUTROS PCT: 53 %
PLATELETS: 372 10*3/uL (ref 150–379)
RBC: 5.06 x10E6/uL (ref 3.77–5.28)
RDW: 13.1 % (ref 12.3–15.4)
WBC: 8.8 10*3/uL (ref 3.4–10.8)

## 2017-05-14 LAB — LIPASE: LIPASE: 22 U/L (ref 14–72)

## 2017-05-14 LAB — HEPATIC FUNCTION PANEL
ALK PHOS: 92 IU/L (ref 39–117)
ALT: 13 IU/L (ref 0–32)
AST: 13 IU/L (ref 0–40)
Albumin: 4.5 g/dL (ref 3.5–5.5)
BILIRUBIN TOTAL: 0.4 mg/dL (ref 0.0–1.2)
BILIRUBIN, DIRECT: 0.08 mg/dL (ref 0.00–0.40)
Total Protein: 7.7 g/dL (ref 6.0–8.5)

## 2017-05-14 LAB — BASIC METABOLIC PANEL
BUN/Creatinine Ratio: 16 (ref 9–23)
BUN: 12 mg/dL (ref 6–20)
CALCIUM: 9.7 mg/dL (ref 8.7–10.2)
CO2: 22 mmol/L (ref 20–29)
Chloride: 103 mmol/L (ref 96–106)
Creatinine, Ser: 0.74 mg/dL (ref 0.57–1.00)
GFR, EST AFRICAN AMERICAN: 132 mL/min/{1.73_m2} (ref >59)
GFR, EST NON AFRICAN AMERICAN: 115 mL/min/{1.73_m2} (ref >59)
Glucose: 104 mg/dL — ABNORMAL HIGH (ref 65–99)
POTASSIUM: 4.5 mmol/L (ref 3.5–5.2)
Sodium: 140 mmol/L (ref 134–144)

## 2017-05-23 ENCOUNTER — Ambulatory Visit (HOSPITAL_COMMUNITY): Payer: BLUE CROSS/BLUE SHIELD

## 2017-05-23 ENCOUNTER — Ambulatory Visit: Payer: Self-pay | Admitting: Family Medicine

## 2017-05-31 ENCOUNTER — Ambulatory Visit: Payer: Self-pay | Admitting: Nurse Practitioner

## 2017-06-07 ENCOUNTER — Encounter: Payer: Self-pay | Admitting: Nurse Practitioner

## 2017-06-07 ENCOUNTER — Ambulatory Visit (HOSPITAL_COMMUNITY)
Admission: RE | Admit: 2017-06-07 | Discharge: 2017-06-07 | Disposition: A | Discharge status: Home / Self Care | Payer: BLUE CROSS/BLUE SHIELD | Source: Ambulatory Visit | Attending: Nurse Practitioner | Admitting: Nurse Practitioner

## 2017-06-07 DIAGNOSIS — R109 Unspecified abdominal pain: Secondary | ICD-10-CM | POA: Insufficient documentation

## 2017-06-07 MED ORDER — IOPAMIDOL (ISOVUE-300) INJECTION 61%
100.0000 mL | Freq: Once | INTRAVENOUS | Status: AC | PRN
  Administered 2017-06-07: 100 mL via INTRAVENOUS

## 2017-06-17 ENCOUNTER — Ambulatory Visit (HOSPITAL_COMMUNITY): Payer: BLUE CROSS/BLUE SHIELD

## 2017-10-08 ENCOUNTER — Encounter: Payer: Self-pay | Admitting: Nurse Practitioner

## 2017-10-22 ENCOUNTER — Encounter: Payer: Self-pay | Admitting: Nurse Practitioner

## 2017-11-25 ENCOUNTER — Encounter: Payer: Self-pay | Admitting: Family Medicine

## 2017-12-04 ENCOUNTER — Telehealth: Payer: Self-pay

## 2017-12-04 NOTE — Telephone Encounter (Signed)
Patient called today stating she has had right lower abd pain for the last two days, it is sharpe/dull in nature. We have no available appt for today,advised to go to ed to be evaluated as this could possibly be appendicitis. Need to be evaluated or scanned. She states she is going on a cruise in two days.She is aware to go to ed to be evaluated.

## 2017-12-18 ENCOUNTER — Encounter: Payer: Self-pay | Admitting: Obstetrics and Gynecology

## 2017-12-19 ENCOUNTER — Ambulatory Visit: Payer: Self-pay | Admitting: Nurse Practitioner

## 2017-12-19 DIAGNOSIS — Z029 Encounter for administrative examinations, unspecified: Secondary | ICD-10-CM

## 2018-01-29 ENCOUNTER — Encounter: Payer: Self-pay | Admitting: Adult Health

## 2018-01-29 ENCOUNTER — Ambulatory Visit (INDEPENDENT_AMBULATORY_CARE_PROVIDER_SITE_OTHER): Payer: BLUE CROSS/BLUE SHIELD | Admitting: Adult Health

## 2018-01-29 ENCOUNTER — Other Ambulatory Visit (HOSPITAL_COMMUNITY)
Admission: RE | Admit: 2018-01-29 | Discharge: 2018-01-29 | Disposition: A | Discharge status: Home / Self Care | Payer: BLUE CROSS/BLUE SHIELD | Source: Ambulatory Visit | Attending: Adult Health | Admitting: Adult Health

## 2018-01-29 VITALS — BP 118/72 | HR 70 | Ht 64.0 in | Wt 161.0 lb

## 2018-01-29 DIAGNOSIS — N920 Excessive and frequent menstruation with regular cycle: Secondary | ICD-10-CM | POA: Insufficient documentation

## 2018-01-29 DIAGNOSIS — N644 Mastodynia: Secondary | ICD-10-CM | POA: Insufficient documentation

## 2018-01-29 DIAGNOSIS — Z01419 Encounter for gynecological examination (general) (routine) without abnormal findings: Secondary | ICD-10-CM | POA: Diagnosis not present

## 2018-01-29 DIAGNOSIS — L918 Other hypertrophic disorders of the skin: Secondary | ICD-10-CM | POA: Diagnosis not present

## 2018-01-29 DIAGNOSIS — Z01411 Encounter for gynecological examination (general) (routine) with abnormal findings: Secondary | ICD-10-CM

## 2018-01-29 HISTORY — DX: Excessive and frequent menstruation with regular cycle: N92.0

## 2018-01-29 HISTORY — DX: Mastodynia: N64.4

## 2018-01-29 HISTORY — DX: Other hypertrophic disorders of the skin: L91.8

## 2018-01-29 NOTE — Progress Notes (Signed)
Patient ID: Rebecca Brown, female   DOB: 06/04/1996, 22 y.o.   MRN: [id number] History of Present Illness: Rebecca Brown is a 22 year old white female, single, in for a well woman gyn exam and first pap.She is lesbian.She has had history of yeast and BV in past.She complains of heavy periods, periods last 5-6 days and heavy for 4, occasional clots and cramps.Has history of breast cysts and removal. PCP is [facility].    Current Medications, Allergies, Past Medical History, Past Surgical History, Family History and Social History were reviewed in [employer] record.     Review of Systems: Patient denies any headaches, hearing loss, fatigue, blurred vision, shortness of breath, chest pain, abdominal pain, problems with bowel movements, urination, or intercourse. No joint pain or mood swings.See HPI For positives.    Physical Exam:BP 118/72 (BP Location: Left Arm, Patient Position: Sitting, Cuff Size: Normal)   Pulse 70   Ht 5\' 4"  (1.626 m)   Wt 161 lb (73 kg)   LMP 01/15/2018   BMI 27.64 kg/m  General:  Well developed, well nourished, no acute distress Skin:  Warm and dry Neck:  Midline trachea, normal thyroid, good ROM, no lymphadenopathy Lungs; Clear to auscultation bilaterally Breast:  No dominant palpable mass, retraction, or nipple discharge,+tenderness right breast at about 11 0' clock  Cardiovascular: Regular rate and rhythm Abdomen:  Soft, non tender, no hepatosplenomegaly Pelvic:  External genitalia is normal in appearance, has skin tag on mons pubis.  The vagina is normal in appearance. Urethra has no lesions or masses. The cervix is nulliparous, pap with CG/CHL performed.  Uterus is felt to be normal size, shape, and contour.  No adnexal masses or tenderness noted.Bladder is non tender, no masses felt. Extremities/musculoskeletal:  No swelling or varicosities noted, no clubbing or cyanosis Psych:  No mood changes, alert and cooperative,seems happy PHQ 2 score  0.Discussed options for period management  Impression: 1. Encounter for gynecological examination with Papanicolaou smear of cervix   2. Skin tag   3. Breast tenderness   4. Menorrhagia with regular cycle       Plan: Physical in 1 year Pap in 3 if normal Return 4/8 at 3:30 pm for skin tag removal Review handout on birth control options and let me know Decrease caffeine and do not wear underwire

## 2018-01-30 LAB — CYTOLOGY - PAP
Adequacy: ABSENT
CHLAMYDIA, DNA PROBE: NEGATIVE
DIAGNOSIS: NEGATIVE
NEISSERIA GONORRHEA: NEGATIVE

## 2018-02-11 ENCOUNTER — Encounter: Payer: Self-pay | Admitting: Family Medicine

## 2018-02-24 ENCOUNTER — Ambulatory Visit (INDEPENDENT_AMBULATORY_CARE_PROVIDER_SITE_OTHER): Payer: BLUE CROSS/BLUE SHIELD | Admitting: Adult Health

## 2018-02-24 ENCOUNTER — Encounter: Payer: Self-pay | Admitting: Adult Health

## 2018-02-24 ENCOUNTER — Other Ambulatory Visit: Payer: Self-pay | Admitting: Adult Health

## 2018-02-24 VITALS — BP 102/60 | HR 80 | Ht 64.0 in | Wt 160.0 lb

## 2018-02-24 DIAGNOSIS — L918 Other hypertrophic disorders of the skin: Secondary | ICD-10-CM

## 2018-02-24 DIAGNOSIS — Z30011 Encounter for initial prescription of contraceptive pills: Secondary | ICD-10-CM | POA: Diagnosis not present

## 2018-02-24 MED ORDER — LEVONORGEST-ETH ESTRAD 91-DAY 0.15-0.03 MG PO TABS: 1.0000 | ORAL_TABLET | Freq: Every day | ORAL | 4 refills | Status: DC

## 2018-02-24 NOTE — Progress Notes (Signed)
Subjective:     Patient ID: Rebecca Brown, female   DOB: 01-Jan-1996, 22 y.o.   MRN: [id number]  HPI Rebecca Brown is a 22 year old white female in for skin tag removal.   Review of Systems +Skin tag Reviewed past medical,surgical, social and family history. Reviewed medications and allergies.     Objective:   Physical Exam BP 102/60 (BP Location: Left Arm, Patient Position: Sitting, Cuff Size: Normal)   Pulse 80   Ht 5\' 4"  (1.626 m)   Wt 160 lb (72.6 kg)   BMI 27.46 kg/m    Consent signed, injected 1 cc 1% Lidocaine at base of skin tag,on mons pubis, waited til numb,cleaned with alcohol, forceps applied to base of skin tag, then cut skin tag off with scissors, small amount of bleeding, silver nitrate applied and it stopped.Skin tag in formalin and sent to pathology She is aware pap is negative and she wants to try jolessa for birth control.  Assessment:     1. Skin tag   2. Encounter for initial prescription of contraceptive pills       Plan:    Keep clean and dry Skin tag sent to pathology  Start jolessa with next period and use condoms if has sex Meds ordered this encounter  Medications  . levonorgestrel-ethinyl estradiol (SEASONALE,INTROVALE,JOLESSA) 0.15-0.03 MG tablet    Sig: Take 1 tablet by mouth daily.    Dispense:  1 Package    Refill:  4    Order Specific Question:   Supervising Provider    Answer:   Elonda Husky, LUTHER H [[id number]]  F/U in 3 months

## 2018-02-26 ENCOUNTER — Encounter: Payer: Self-pay | Admitting: Adult Health

## 2018-02-26 DIAGNOSIS — B078 Other viral warts: Secondary | ICD-10-CM | POA: Insufficient documentation

## 2018-02-26 DIAGNOSIS — B079 Viral wart, unspecified: Secondary | ICD-10-CM

## 2018-02-26 HISTORY — DX: Viral wart, unspecified: B07.9

## 2018-05-01 ENCOUNTER — Encounter: Payer: Self-pay | Admitting: Adult Health

## 2018-05-01 ENCOUNTER — Other Ambulatory Visit: Payer: Self-pay

## 2018-05-01 ENCOUNTER — Ambulatory Visit (INDEPENDENT_AMBULATORY_CARE_PROVIDER_SITE_OTHER): Payer: BLUE CROSS/BLUE SHIELD | Admitting: Adult Health

## 2018-05-01 VITALS — BP 111/71 | HR 73 | Ht 64.0 in | Wt 163.0 lb

## 2018-05-01 DIAGNOSIS — Z7689 Persons encountering health services in other specified circumstances: Secondary | ICD-10-CM

## 2018-05-01 DIAGNOSIS — N920 Excessive and frequent menstruation with regular cycle: Secondary | ICD-10-CM

## 2018-05-01 DIAGNOSIS — Z30013 Encounter for initial prescription of injectable contraceptive: Secondary | ICD-10-CM

## 2018-05-01 MED ORDER — MEDROXYPROGESTERONE ACETATE 150 MG/ML IM SUSP: 150.0000 mg | INTRAMUSCULAR | 4 refills | Status: DC

## 2018-05-01 NOTE — Progress Notes (Signed)
  Subjective:     Patient ID: Rebecca Brown, female   DOB: [birth year], 22 y.o.   MRN: [id number]  HPI Rebecca Brown is a 22 year old white female in to discuss starting depo for period management.   Review of Systems Periods heavy on OCs and since stopping still bleeding Has not had sex with female in years,has female partner Reviewed past medical,surgical, social and family history. Reviewed medications and allergies.     Objective:   Physical Exam BP 111/71 (BP Location: Left Arm, Patient Position: Sitting, Cuff Size: Normal)   Pulse 73   Ht 5\' 4"  (1.626 m)   Wt 163 lb (73.9 kg)   LMP 04/02/2018   BMI 27.98 kg/m  Skin warm and dry. Lungs: clear to ausculation bilaterally. Cardiovascular: regular rate and rhythm.   Mystery wants to get depo, tried OCs and had bleeding.Her sister is on depo and really likes it, so will rx depo.   Assessment:     1. Encounter for menstrual regulation   2. Menorrhagia with regular cycle       Plan:     Meds ordered this encounter  Medications  . medroxyPROGESTERone (DEPO-PROVERA) 150 MG/ML injection    Sig: Inject 1 mL (150 mg total) into the muscle every 3 (three) months.    Dispense:  1 mL    Refill:  4    Order Specific Question:   Supervising Provider    Answer:   Tania Ade H [[id number]]  Return in 1 day for depo injection

## 2018-05-02 ENCOUNTER — Ambulatory Visit: Payer: Self-pay

## 2018-05-09 ENCOUNTER — Ambulatory Visit: Payer: Self-pay

## 2018-05-26 ENCOUNTER — Ambulatory Visit: Payer: Self-pay | Admitting: Adult Health

## 2018-08-18 ENCOUNTER — Other Ambulatory Visit (HOSPITAL_COMMUNITY): Payer: Self-pay | Admitting: Nurse Practitioner

## 2018-08-18 ENCOUNTER — Ambulatory Visit (HOSPITAL_COMMUNITY)
Admission: RE | Admit: 2018-08-18 | Discharge: 2018-08-18 | Disposition: A | Discharge status: Home / Self Care | Payer: BLUE CROSS/BLUE SHIELD | Source: Ambulatory Visit | Attending: Nurse Practitioner | Admitting: Nurse Practitioner

## 2018-08-18 DIAGNOSIS — R109 Unspecified abdominal pain: Secondary | ICD-10-CM | POA: Diagnosis not present

## 2018-08-18 DIAGNOSIS — R1030 Lower abdominal pain, unspecified: Secondary | ICD-10-CM

## 2018-09-03 ENCOUNTER — Encounter (HOSPITAL_COMMUNITY): Payer: Self-pay | Admitting: Emergency Medicine

## 2018-09-03 ENCOUNTER — Emergency Department (HOSPITAL_COMMUNITY)
Admission: EM | Admit: 2018-09-03 | Discharge: 2018-09-03 | Disposition: A | Discharge status: Home / Self Care | Payer: BLUE CROSS/BLUE SHIELD | Attending: Emergency Medicine | Admitting: Emergency Medicine

## 2018-09-03 ENCOUNTER — Emergency Department (HOSPITAL_COMMUNITY): Payer: BLUE CROSS/BLUE SHIELD

## 2018-09-03 ENCOUNTER — Other Ambulatory Visit: Payer: Self-pay

## 2018-09-03 DIAGNOSIS — F17210 Nicotine dependence, cigarettes, uncomplicated: Secondary | ICD-10-CM | POA: Diagnosis not present

## 2018-09-03 DIAGNOSIS — J069 Acute upper respiratory infection, unspecified: Secondary | ICD-10-CM | POA: Diagnosis not present

## 2018-09-03 DIAGNOSIS — Z79899 Other long term (current) drug therapy: Secondary | ICD-10-CM | POA: Diagnosis not present

## 2018-09-03 DIAGNOSIS — R0602 Shortness of breath: Secondary | ICD-10-CM | POA: Diagnosis present

## 2018-09-03 MED ORDER — ALBUTEROL SULFATE HFA 108 (90 BASE) MCG/ACT IN AERS: 2.0000 | INHALATION_SPRAY | RESPIRATORY_TRACT | 0 refills | Status: DC | PRN

## 2018-09-03 MED ORDER — PREDNISONE 20 MG PO TABS: 60.0000 mg | ORAL_TABLET | Freq: Every day | ORAL | 0 refills | Status: AC

## 2018-09-03 MED ORDER — BENZONATATE 200 MG PO CAPS: 200.0000 mg | ORAL_CAPSULE | Freq: Three times a day (TID) | ORAL | 0 refills | Status: AC | PRN

## 2018-09-03 NOTE — Discharge Instructions (Addendum)
Evaluated today for cough and shortness of breath.  Your EKG and chest x-ray were normal.  This most likely upper respiratory infection.  Please use the prescribed medications as I have instructed.  Please keep your follow-up appointment with the cardiologist that you have in the next few weeks.  Return to the ED for any new or worsening symptoms such as:  Get help right away if: You have very bad or constant: Headache. Ear pain. Pain in your forehead, behind your eyes, and over your cheekbones (sinus pain). Chest pain. You have long-lasting (chronic) lung disease and any of the following: Wheezing. Long-lasting cough. Coughing up blood. A change in your usual mucus. You have a stiff neck. You have changes in your: Vision. Hearing. Thinking. Mood.

## 2018-09-03 NOTE — ED Triage Notes (Addendum)
Pt c/o cough, SOB, and subjective fever since last night. Pt also reports intermittent CP for at least a year. States CP worsens with coughing.

## 2018-09-03 NOTE — ED Provider Notes (Signed)
[facility] Provider Note   CSN: [id number] Arrival date & time: 09/03/18  1816   History   Chief Complaint Chief Complaint  Patient presents with  . Shortness of Breath    HPI Rebecca Brown is a 22 y.o. female past medical history significant ASD repair in 2013, anxiety who presents for evaluation of cough and shortness of breath.  Patient states that she has had sinus congestion and a cough x1 day.  States she gets chest wall pain when she coughs.  No pain when she is not coughing.  Her coughing is worse when she lays down, as she feels like her nasal drainage tripped on the back of her throat.  Admits to "chills for 1 hour last night."  States that she sees cardiology in [place] and has a follow-up appointment in 2 weeks.  Denies fever, hemoptysis, nausea, vomiting, chest pain, abdominal pain, dysuria, sore throat. Two additional people whom the patient lives with have had similar symptoms over the last 48 hours.  Denies aggravating or alleviating factors.  HPI  Past Medical History:  Diagnosis Date  . Anxiety   . Cardiac abnormality    Percutaneous ASD repaired 06/10/2012  . Right ovarian cyst 03/10/2013    Patient Active Problem List   Diagnosis Date Noted  . Verruca vulgaris 02/26/2018  . Menorrhagia with regular cycle 01/29/2018  . Breast tenderness 01/29/2018  . Skin tag 01/29/2018  . Encounter for gynecological examination with Papanicolaou smear of cervix 01/29/2018  . 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 Acute gastritis without hemorrhage 05/09/2017  . Fibrocystic breast changes, bilateral 02/23/2016  . Cardiac abnormality 03/10/2013  . Right ovarian cyst 03/10/2013    Past Surgical History:  Procedure Laterality Date  . Amplatzer occluder  06/10/2012  . BREAST SURGERY  06/27/12   bil br masses  . WISDOM TOOTH EXTRACTION       OB History    Gravida  0   Para  0   Term  0   Preterm  0   AB    0   Living  0     SAB  0   TAB  0   Ectopic  0   Multiple  0   Live Births  0            Home Medications    Prior to Admission medications   Medication Sig Start Date End Date Taking? Authorizing Provider  albuterol (PROVENTIL HFA;VENTOLIN HFA) 108 (90 Base) MCG/ACT inhaler Inhale 2 puffs into the lungs every 4 (four) hours as needed for wheezing or shortness of breath. 09/03/18 10/03/18  Henderly, Britni A, PA-C  benzonatate (TESSALON) 200 MG capsule Take 1 capsule (200 mg total) by mouth 3 (three) times daily as needed for up to 7 days for cough. 09/03/18 09/10/18  Henderly, Britni A, PA-C  medroxyPROGESTERone (DEPO-PROVERA) 150 MG/ML injection Inject 1 mL (150 mg total) into the muscle every 3 (three) months. 05/01/18   Estill Dooms, NP  predniSONE (DELTASONE) 20 MG tablet Take 3 tablets (60 mg total) by mouth daily for 5 days. 09/03/18 09/08/18  Henderly, Britni A, PA-C    Family History Family History  Problem Relation Age of Onset  . Asthma Other   . Cancer Other   . Deafness Other   . Blindness Other   . Hypertension Paternal Grandfather   . Hypertension Paternal Grandmother     Social History Social History  Tobacco Use  . Smoking status: Current Every Day Smoker    Packs/day: 0.50    Types: Cigarettes  . Smokeless tobacco: Never Used  Substance Use Topics  . Alcohol use: Yes    Comment: socially   . Drug use: No     Allergies   Doxycycline; Hydrocodone; and Lactose intolerance (gi)   Review of Systems Review of Systems  Constitutional: Positive for chills. Negative for activity change, appetite change, diaphoresis, fatigue, fever and unexpected weight change.  HENT: Positive for postnasal drip and rhinorrhea. Negative for drooling, ear discharge, ear pain, facial swelling, hearing loss, nosebleeds, sinus pressure, sinus pain, sneezing, sore throat, tinnitus, trouble swallowing and voice change.   Respiratory: Positive for cough and  shortness of breath. Negative for choking, chest tightness, wheezing and stridor.   Cardiovascular: Negative for chest pain, palpitations and leg swelling.  Skin: Negative.   Neurological: Negative.      Physical Exam Updated Vital Signs BP 124/85 (BP Location: Right Arm)   Pulse 99   Temp 98.2 F (36.8 C) (Oral)   Resp 16   Ht 5\' 3"  (1.6 m)   Wt 74.8 kg   LMP 09/02/2018 (Exact Date)   SpO2 98%   BMI 29.23 kg/m   Physical Exam  Constitutional: She appears well-developed and well-nourished.  Non-toxic appearance. She does not have a sickly appearance. She does not appear ill. No distress.  HENT:  Head: Atraumatic.  Right Ear: Tympanic membrane, external ear and ear canal normal.  Left Ear: Tympanic membrane, external ear and ear canal normal.  Nose: Rhinorrhea present. No mucosal edema, sinus tenderness, nasal deformity, septal deviation or nasal septal hematoma. No epistaxis.  No foreign bodies. Right sinus exhibits no maxillary sinus tenderness and no frontal sinus tenderness. Left sinus exhibits no maxillary sinus tenderness and no frontal sinus tenderness.  Mouth/Throat: Uvula is midline and mucous membranes are normal. Posterior oropharyngeal erythema present. No oropharyngeal exudate, posterior oropharyngeal edema or tonsillar abscesses. No tonsillar exudate.  Eyes: Pupils are equal, round, and reactive to light.  Neck: Normal range of motion.  Cardiovascular: Normal rate, normal heart sounds, intact distal pulses and normal pulses.  Pulmonary/Chest: Breath sounds normal. No accessory muscle usage or stridor. No tachypnea. No respiratory distress. She has no decreased breath sounds. She has no wheezes. She has no rhonchi. She has no rales. She exhibits tenderness.  Patient is able to speak without difficulty.  No accessory muscle usage.  Patient is able to ambulate without SOB.  No retractions.  No decreased breath sounds, no wheezing rhonchi or rales on auscultation.    Abdominal: She exhibits no distension.  Musculoskeletal: Normal range of motion.  Neurological: She is alert.  Skin: Skin is warm and dry. She is not diaphoretic.  Psychiatric: She has a normal mood and affect.  Nursing note and vitals reviewed.    ED Treatments / Results  Labs (all labs ordered are listed, but only abnormal results are displayed) Labs Reviewed - No data to display  EKG None  Radiology Dg Chest 2 View  Result Date: 09/03/2018 CLINICAL DATA:  Cough and shortness of breath with chest pain EXAM: CHEST - 2 VIEW COMPARISON:  05/09/2015 FINDINGS: Normal heart size and mediastinal contours. Atrial occluded device. No acute infiltrate or edema. No effusion or pneumothorax. No acute osseous findings. IMPRESSION: No evidence of active disease. Electronically Signed   By: Monte Fantasia M.D.   On: 09/03/2018 19:44    Procedures Procedures (including critical care time)  Medications Ordered in ED Medications - No data to display   Initial Impression / Assessment and Plan / ED Course  I have reviewed the triage vital signs and the nursing notes.  Pertinent labs & imaging results that were available during my care of the patient were reviewed by me and considered in my medical decision making (see chart for details).  22 year old female who appears otherwise well presents for evaluation of cough and shortness of breath x 1day.  Afebrile, nonseptic, non-ill-appearing. Chest wall tenderness to palpation.  Lungs clear to auscultation. Able to speak without difficulty and ambulate without shortness of breath.  She does not appear in any acute respiratory distress.  PERC negative.  Symptoms are most likely upper respiratory infection.  Discussed symptomatic management.  Patient importance of following up with her cardiologist for her regular follow-up in 2 weeks.    Chest x-ray negative for infiltrates, pneumothorax, CHF or pulmonary edema.  Discussed reasons to return to the  emergency department.  Patient and significant other voiced understanding are agreeable for follow-up.     This note was dictated with voice recognition software.  Despite best efforts at proofreading, errors may occur which can change the documentation meaning.  Final Clinical Impressions(s) / ED Diagnoses   Final diagnoses:  URI (upper respiratory infection)    ED Discharge Orders         Ordered    albuterol (PROVENTIL HFA;VENTOLIN HFA) 108 (90 Base) MCG/ACT inhaler  Every 4 hours PRN     09/03/18 1959    predniSONE (DELTASONE) 20 MG tablet  Daily     09/03/18 1959    benzonatate (TESSALON) 200 MG capsule  3 times daily PRN     09/03/18 1959           Henderly, Britni A, PA-C 09/03/18 Alden Benjamin, MD 09/05/18 1158

## 2018-09-25 NOTE — Progress Notes (Signed)
Cardiology Office Note:    Date:  09/26/2018   ID:  Rebecca LAWNICZAK, DOB 02-14-1996, MRN [id number]  PCP:  Celene Squibb, MD  Cardiologist:  No primary care provider on file.   Referring MD: Pablo Lawrence, NP   Chief Complaint  Patient presents with  . Advice Only    ASD repair  . Atrial Septal Defect    History of Present Illness:    Rebecca Brown is a 22 y.o. female with a hx of ASD surgical repair who is referred for cardiac consultation by Rebecca Brown for atrial arrhythmia.Rebecca Brown has history of ASD previously unrecognized until 2013.  At that time she was having palpitations and dyspnea which led to evaluation.  An ASD was identified and treated definitively with an Amplatzer closure device in 2013.  Her last cardiology contact was in 2016 with the [facility] pediatric service when repeat heart catheterization was performed and identified mild left ventricular diastolic dysfunction, no evidence of shunting, and widely patent coronary arteries.  Echocardiography at that time demonstrated no residual defect, borderline diminished left ventricular systolic function, and borderline left atrial dilatation.  Recently, she has had sharp stabbing chest pains.  These episodes are nonexertional.  They can last up to 1 to 2 minutes or be shorter.  No significant orthopnea, PND, or syncope.  Occasional episodes where her heart either speeds up lasting up to 10 minutes with a heart racing.  On another occasion she feels that the heart is slowing down.  She has not had syncope associated.  Episodes are unpredictable.  Past Medical History:  Diagnosis Date  . Acute gastritis without hemorrhage 05/09/2017  . Anxiety   . Breast tenderness 01/29/2018  . Cardiac abnormality    Percutaneous ASD repaired 06/10/2012  . 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 Fibrocystic breast changes, bilateral 02/23/2016  . 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 Menorrhagia with  regular cycle 01/29/2018  . Right ovarian cyst 03/10/2013  . Skin tag 01/29/2018  . Verruca vulgaris 02/26/2018    Past Surgical History:  Procedure Laterality Date  . Amplatzer occluder  06/10/2012  . BREAST SURGERY  06/27/12   bil br masses  . WISDOM TOOTH EXTRACTION      Current Medications: Current Meds  Medication Sig  . albuterol (PROVENTIL HFA;VENTOLIN HFA) 108 (90 Base) MCG/ACT inhaler Inhale 2 puffs into the lungs every 4 (four) hours as needed for wheezing or shortness of breath.     Allergies:   Doxycycline; Hydrocodone; and Lactose intolerance (gi)   Social History   Socioeconomic History  . Marital status: Single    Spouse name: Not on file  . Number of children: Not on file  . Years of education: Not on file  . Highest education level: Not on file  Occupational History  . Not on file  Social Needs  . Financial resource strain: Not on file  . Food insecurity:    Worry: Not on file    Inability: Not on file  . Transportation needs:    Medical: Not on file    Non-medical: Not on file  Tobacco Use  . Smoking status: Current Every Day Smoker    Packs/day: 0.50    Types: Cigarettes  . Smokeless tobacco: Never Used  Substance and Sexual Activity  . Alcohol use: Yes    Comment: socially   . Drug use: No  . Sexual activity: Yes    Birth control/protection: None  Lifestyle  . Physical activity:    Days per week: Not on file    Minutes per session: Not on file  . Stress: Not on file  Relationships  . Social connections:    Talks on phone: Not on file    Gets together: Not on file    Attends religious service: Not on file    Active member of club or organization: Not on file    Attends meetings of clubs or organizations: Not on file    Relationship status: Not on file  Other Topics Concern  . Not on file  Social History Narrative  . Not on file     Family History: The patient's family history includes Asthma in her other; Blindness in her other;  Cancer in her other; Deafness in her other; Hypertension in her paternal grandfather and paternal grandmother.  ROS:   Please see the history of present illness.    Cough, diarrhea, back pain, headaches, constipation, and chest pressure.  All other systems reviewed and are negative.  EKGs/Labs/Other Studies Reviewed:    The following studies were reviewed today:   Significant time was spent reviewing records from [facility] in [place] related to her prior cardiovascular therapy.   Most recent 2D Doppler echocardiogram 2016 at [facility]: Summary:  1. Normal mitral valve.  2. Normal tricuspid valve.  3. Normal size left ventricle.  4. Borderline diminished left ventricle systolic function.  5. S/p atrial septal defect device closure.  6. No residual atrial defect or shunt evident. Device appears in appropriate position.  7. Leftward cardiac apex.  8. Levoposition (heart in the left chest).  9. Normal main and branch pulmonary arteries. 10. Normal-size right atrium. 11. Borderline left atrial dilatation. 12. Normal coronary arteries. 13. Normal aorta. 14. Normal aortic valve. 15. Normal pulmonary veins. 16. Intact interventricular septum. 17. No left ventricular outflow tract obstruction. 18. No pericardial effusion. 19. No right ventricular outflow tract obstruction. 20. Normal pulmonary valve. 21. Normal right ventricular cavity size and systolic function. 22. Right ventricular systolic pressure estimate = 26.0 mmHg  EKG:  EKG is at ordered today.  The ekg ordered on 09/03/2018 demonstrates right axis deviation with tall R wave in V1 consistent with right ventricular hypertrophy.  Nonspecific ST-T wave abnormality and precordial T wave inversion.  Recent Labs: No results found for requested labs within last 8760 hours.  Recent Lipid Panel No results found for: CHOL, TRIG, HDL, CHOLHDL, VLDL, LDLCALC, LDLDIRECT  Physical Exam:    VS:  BP 112/68   Pulse 60    Ht 5\' 3"  (1.6 m)   Wt 157 lb 6.4 oz (71.4 kg)   LMP 09/02/2018 (Exact Date)   SpO2 93%   BMI 27.88 kg/m     Wt Readings from Last 3 Encounters:  09/26/18 157 lb 6.4 oz (71.4 kg)  09/03/18 165 lb (74.8 kg)  05/01/18 163 lb (73.9 kg)     GEN:  Well nourished, well developed in no acute distress HEENT: Normal NECK: No JVD. LYMPHATICS: No lymphadenopathy CARDIAC: RRR, no murmur, no gallop, no edema. VASCULAR: 2+ bilateral radial and dorsalis pedis bilateral pulses.  No bruits. RESPIRATORY:  Clear to auscultation without rales, wheezing or rhonchi  ABDOMEN: Soft, non-tender, non-distended, No pulsatile mass, MUSCULOSKELETAL: No deformity  SKIN: Warm and dry NEUROLOGIC:  Alert and oriented x 3 PSYCHIATRIC:  Normal affect   ASSESSMENT:    1. Atrial arrhythmia   2. H/O congenital atrial septal defect (ASD) repair  3. Palpitations    PLAN:    In order of problems listed above:  1. 2-week Holter monitor 2. 2D Doppler echocardiogram to assess stability of Amplatzer ASD device.  Reassess LV function.  Prior history of low normal LVEF.  She will need clinical follow-up once a year.  For the management and or evaluation will depend upon findings of the monitor and echo cardiogram.   Medication Adjustments/Labs and Tests Ordered: Current medicines are reviewed at length with the patient today.  Concerns regarding medicines are outlined above.  Orders Placed This Encounter  Procedures  . LONG TERM MONITOR (3-14 DAYS)  . ECHOCARDIOGRAM COMPLETE   No orders of the defined types were placed in this encounter.   Patient Instructions  Medication Instructions:  Your physician recommends that you continue on your current medications as directed. Please refer to the Current Medication list given to you today.   If you need a refill on your cardiac medications before your next appointment, please call your pharmacy.   Lab work: None If you have labs (blood work) drawn today  and your tests are completely normal, you will receive your results only by: Marland Kitchen MyChart Message (if you have MyChart) OR . A paper copy in the mail If you have any lab test that is abnormal or we need to change your treatment, we will call you to review the results.  Testing/Procedures: Your physician has requested that you have an echocardiogram. Echocardiography is a painless test that uses sound waves to create images of your heart. It provides your doctor with information about the size and shape of your heart and how well your heart's chambers and valves are working. This procedure takes approximately one hour. There are no restrictions for this procedure.  Your physician has recommended that you wear a long term (Zio) holter monitor. Holter monitors are medical devices that record the heart's electrical activity. Doctors most often use these monitors to diagnose arrhythmias. Arrhythmias are problems with the speed or rhythm of the heartbeat. The monitor is a small, portable device. You can wear one while you do your normal daily activities. This is usually used to diagnose what is causing palpitations/syncope (passing out).    Follow-Up: At [facility], you and your health needs are our priority.  As part of our continuing mission to provide you with exceptional heart care, we have created designated Provider Care Teams.  These Care Teams include your primary Cardiologist (physician) and Advanced Practice Providers (APPs -  Physician Assistants and Nurse Practitioners) who all work together to provide you with the care you need, when you need it. You will need a follow up appointment in 18-24 months.  Please call our office 2 months in advance to schedule this appointment.  You may see Dr. Tamala Julian or one of the following Advanced Practice Providers on your designated Care Team:   Truitt Merle, NP Cecilie Kicks, NP . Kathyrn Drown, NP  Any Other Special Instructions Will Be Listed Below (If  Applicable).       Signed, Sinclair Grooms, MD  09/26/2018 2:14 PM    [facility]

## 2018-09-26 ENCOUNTER — Encounter: Payer: Self-pay | Admitting: Interventional Cardiology

## 2018-09-26 ENCOUNTER — Ambulatory Visit (INDEPENDENT_AMBULATORY_CARE_PROVIDER_SITE_OTHER): Payer: BLUE CROSS/BLUE SHIELD | Admitting: Interventional Cardiology

## 2018-09-26 VITALS — BP 112/68 | HR 60 | Ht 63.0 in | Wt 157.4 lb

## 2018-09-26 DIAGNOSIS — Z8774 Personal history of (corrected) congenital malformations of heart and circulatory system: Secondary | ICD-10-CM

## 2018-09-26 DIAGNOSIS — R002 Palpitations: Secondary | ICD-10-CM | POA: Diagnosis not present

## 2018-09-26 DIAGNOSIS — I498 Other specified cardiac arrhythmias: Secondary | ICD-10-CM

## 2018-09-26 NOTE — Patient Instructions (Addendum)
Medication Instructions:  Your physician recommends that you continue on your current medications as directed. Please refer to the Current Medication list given to you today.   If you need a refill on your cardiac medications before your next appointment, please call your pharmacy.   Lab work: None If you have labs (blood work) drawn today and your tests are completely normal, you will receive your results only by: Marland Kitchen MyChart Message (if you have MyChart) OR . A paper copy in the mail If you have any lab test that is abnormal or we need to change your treatment, we will call you to review the results.  Testing/Procedures: Your physician has requested that you have an echocardiogram. Echocardiography is a painless test that uses sound waves to create images of your heart. It provides your doctor with information about the size and shape of your heart and how well your heart's chambers and valves are working. This procedure takes approximately one hour. There are no restrictions for this procedure.  Your physician has recommended that you wear a long term (Zio) holter monitor. Holter monitors are medical devices that record the heart's electrical activity. Doctors most often use these monitors to diagnose arrhythmias. Arrhythmias are problems with the speed or rhythm of the heartbeat. The monitor is a small, portable device. You can wear one while you do your normal daily activities. This is usually used to diagnose what is causing palpitations/syncope (passing out).    Follow-Up: At [facility], you and your health needs are our priority.  As part of our continuing mission to provide you with exceptional heart care, we have created designated Provider Care Teams.  These Care Teams include your primary Cardiologist (physician) and Advanced Practice Providers (APPs -  Physician Assistants and Nurse Practitioners) who all work together to provide you with the care you need, when you need it. You  will need a follow up appointment in 18-24 months.  Please call our office 2 months in advance to schedule this appointment.  You may see Dr. Tamala Julian or one of the following Advanced Practice Providers on your designated Care Team:   Truitt Merle, NP Cecilie Kicks, NP . Kathyrn Drown, NP  Any Other Special Instructions Will Be Listed Below (If Applicable).

## 2018-10-08 ENCOUNTER — Ambulatory Visit (INDEPENDENT_AMBULATORY_CARE_PROVIDER_SITE_OTHER): Payer: BLUE CROSS/BLUE SHIELD

## 2018-10-08 ENCOUNTER — Other Ambulatory Visit: Payer: Self-pay

## 2018-10-08 ENCOUNTER — Ambulatory Visit (HOSPITAL_COMMUNITY): Payer: BLUE CROSS/BLUE SHIELD | Attending: Interventional Cardiology

## 2018-10-08 DIAGNOSIS — I498 Other specified cardiac arrhythmias: Secondary | ICD-10-CM

## 2018-10-08 DIAGNOSIS — R002 Palpitations: Secondary | ICD-10-CM

## 2018-10-08 DIAGNOSIS — Z8774 Personal history of (corrected) congenital malformations of heart and circulatory system: Secondary | ICD-10-CM

## 2021-06-29 ENCOUNTER — Telehealth: Payer: Self-pay | Admitting: Interventional Cardiology

## 2021-06-29 NOTE — Telephone Encounter (Signed)
Patient's previous pediatric cardiologist, Dr. Ermalene Searing, calling to inform the patient was recently seen in the ED and her HR got very low on an EKG. He states the patient was told the soonest appointment she could get would be in October. He would like to know if there is any way the patient can be seen before then by anyone. He states he will also fax over notes from the patient, although they are from 6 years ago when she was seen at his office. He would like the patient to be called back.

## 2021-06-29 NOTE — Telephone Encounter (Signed)
Spoke with Rebecca Brown and scheduled her to come in to see Dr. Tamala Julian on 8/18.

## 2021-06-30 ENCOUNTER — Telehealth: Payer: Self-pay

## 2021-06-30 NOTE — Telephone Encounter (Signed)
I have called and faxed for records from [facility] medical records phone #: [phone], fax # [phone].

## 2021-07-01 ENCOUNTER — Emergency Department (HOSPITAL_COMMUNITY): Payer: BC Managed Care – PPO

## 2021-07-01 ENCOUNTER — Other Ambulatory Visit: Payer: Self-pay

## 2021-07-01 ENCOUNTER — Emergency Department (HOSPITAL_COMMUNITY)
Admission: EM | Admit: 2021-07-01 | Discharge: 2021-07-02 | Disposition: A | Discharge status: Home / Self Care | Payer: BC Managed Care – PPO | Attending: Emergency Medicine | Admitting: Emergency Medicine

## 2021-07-01 DIAGNOSIS — F17210 Nicotine dependence, cigarettes, uncomplicated: Secondary | ICD-10-CM | POA: Insufficient documentation

## 2021-07-01 DIAGNOSIS — R0789 Other chest pain: Secondary | ICD-10-CM | POA: Diagnosis present

## 2021-07-01 DIAGNOSIS — M5410 Radiculopathy, site unspecified: Secondary | ICD-10-CM

## 2021-07-01 DIAGNOSIS — R200 Anesthesia of skin: Secondary | ICD-10-CM | POA: Diagnosis not present

## 2021-07-01 LAB — BASIC METABOLIC PANEL
Anion gap: 5 (ref 5–15)
BUN: 14 mg/dL (ref 6–20)
CO2: 25 mmol/L (ref 22–32)
Calcium: 8.8 mg/dL — ABNORMAL LOW (ref 8.9–10.3)
Chloride: 105 mmol/L (ref 98–111)
Creatinine, Ser: 0.93 mg/dL (ref 0.44–1.00)
GFR, Estimated: >60 mL/min (ref >60)
Glucose, Bld: 102 mg/dL — ABNORMAL HIGH (ref 70–99)
Potassium: 3.5 mmol/L (ref 3.5–5.1)
Sodium: 135 mmol/L (ref 135–145)

## 2021-07-01 LAB — CBC
HCT: 39.7 % (ref 36.0–46.0)
Hemoglobin: 13.5 g/dL (ref 12.0–15.0)
MCH: 29.6 pg (ref 26.0–34.0)
MCHC: 34 g/dL (ref 30.0–36.0)
MCV: 87.1 fL (ref 80.0–100.0)
Platelets: 372 10*3/uL (ref 150–400)
RBC: 4.56 MIL/uL (ref 3.87–5.11)
RDW: 11.9 % (ref 11.5–15.5)
WBC: 10.7 10*3/uL — ABNORMAL HIGH (ref 4.0–10.5)
nRBC: 0 % (ref 0.0–0.2)

## 2021-07-01 LAB — TROPONIN I (HIGH SENSITIVITY): Troponin I (High Sensitivity): <2 ng/L (ref <18)

## 2021-07-01 MED ORDER — METHYLPREDNISOLONE 4 MG PO TBPK
ORAL_TABLET | ORAL | 0 refills | Status: DC
Start: 2021-07-01 — End: 2021-09-04

## 2021-07-01 NOTE — ED Provider Notes (Signed)
[facility] EMERGENCY DEPARTMENT Provider Note   CSN: [id number] Arrival date & time: 07/01/21  2130     History Chief Complaint  Patient presents with   Chest Pain    Rebecca Brown is a 25 y.o. female.  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 She describes her chest pain as pressure, worse on the left side. She also complains of pain in her upper back, left shoulder, and left arm down to her middle and ring fingers. She denies any trauma to these areas. Reports some nausea. Patient was seen Wednesday in [place] for similar symptoms and was diagnosed with valvular heart disease. She has a cardiology appointment scheduled for next week but with the severity of her symptoms she felt she could not wait until then. Denies fever, chills, vomiting, abdominal pain, diarrhea, constipation, lightheadedness, headaches, or syncopal episodes.  She reports no other medical problems and takes no medications.   The history is provided by the patient.  Chest Pain Pain location:  Substernal area and L chest Pain quality: pressure   Pain radiates to:  L shoulder Pain severity:  Moderate Onset quality:  Sudden Duration:  3 days Timing:  Constant Progression:  Unchanged Chronicity:  New Relieved by:  Nothing Worsened by:  Nothing Ineffective treatments:  None tried Associated symptoms: nausea and shortness of breath   Associated symptoms: no abdominal pain, no back pain, no cough, no dizziness, no fatigue, no fever, no headache, no near-syncope, no syncope and no vomiting       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   Fibrocystic breast changes, bilateral 02/23/2016   Irritable  bowel syndrome with both constipation and diarrhea 05/09/2017   Menorrhagia with regular cycle 01/29/2018   Right ovarian cyst 03/10/2013   Skin tag 01/29/2018   Verruca vulgaris 02/26/2018    Patient Active Problem List   Diagnosis Date Noted   Verruca vulgaris 02/26/2018   Menorrhagia with regular cycle 01/29/2018   Breast tenderness 01/29/2018   Skin tag 01/29/2018   Encounter for gynecological examination with Papanicolaou smear of cervix 01/29/2018   Irritable bowel syndrome with both constipation and diarrhea 05/09/2017   Acute gastritis without hemorrhage 05/09/2017   Fibrocystic breast changes, bilateral 02/23/2016   Cardiac abnormality 03/10/2013   Right ovarian cyst 03/10/2013    Past Surgical History:  Procedure Laterality Date   Amplatzer occluder  06/10/2012   BREAST SURGERY  06/27/12   bil br masses   WISDOM TOOTH EXTRACTION       OB History     Gravida  0   Para  0   Term  0   Preterm  0   AB  0   Living  0      SAB  0   IAB  0   Ectopic  0   Multiple  0   Live Births  0           Family History  Problem Relation Age of Onset   Asthma Other    Cancer Other    Deafness Other    Blindness Other    Hypertension Paternal Grandfather    Hypertension Paternal Grandmother  Social History   Tobacco Use   Smoking status: Every Day    Packs/day: 0.50    Types: Cigarettes   Smokeless tobacco: Never  Vaping Use   Vaping Use: Never used  Substance Use Topics   Alcohol use: Yes    Comment: socially    Drug use: No    Home Medications Prior to Admission medications   Medication Sig Start Date End Date Taking? Authorizing Provider  methylPREDNISolone (MEDROL DOSEPAK) 4 MG TBPK tablet Taper over 6 days 07/01/21  Yes Dimitria Ketchum T, PA-C  albuterol (PROVENTIL HFA;VENTOLIN HFA) 108 (90 Base) MCG/ACT inhaler Inhale 2 puffs into the lungs every 4 (four) hours as needed for wheezing or shortness of breath. 09/03/18 10/03/18  Henderly,  Britni A, PA-C    Allergies    Doxycycline, Hydrocodone, and Lactose intolerance (gi)  Review of Systems   Review of Systems  Constitutional:  Negative for activity change, chills, fatigue and fever.  HENT:  Negative for congestion.   Respiratory:  Positive for shortness of breath. Negative for cough.   Cardiovascular:  Positive for chest pain. Negative for syncope and near-syncope.  Gastrointestinal:  Positive for nausea. Negative for abdominal pain, constipation, diarrhea and vomiting.  Genitourinary:  Negative for flank pain.  Musculoskeletal:  Negative for back pain.  Neurological:  Negative for dizziness, light-headedness and headaches.  All other systems reviewed and are negative.  Physical Exam Updated Vital Signs BP 116/77   Pulse (!) 57   Temp 98.2 F (36.8 C) (Oral)   Resp 15   Ht '5\' 4"'$  (1.626 m)   Wt 79.4 kg   SpO2 99%   BMI 30.04 kg/m   Physical Exam Vitals and nursing note reviewed.  Constitutional:      Appearance: Normal appearance. She is well-developed.  HENT:     Head: Normocephalic and atraumatic.  Eyes:     Conjunctiva/sclera: Conjunctivae normal.  Cardiovascular:     Rate and Rhythm: Normal rate and regular rhythm.  Pulmonary:     Effort: Pulmonary effort is normal. No respiratory distress.     Breath sounds: Normal breath sounds.  Chest:     Chest wall: No tenderness.  Abdominal:     General: There is no distension.     Palpations: Abdomen is soft.     Tenderness: There is no abdominal tenderness.  Skin:    General: Skin is warm and dry.  Neurological:     General: No focal deficit present.     Mental Status: She is alert and oriented to person, place, and time.    ED Results / Procedures / Treatments   Labs (all labs ordered are listed, but only abnormal results are displayed) Labs Reviewed  BASIC METABOLIC PANEL - Abnormal; Notable for the following components:      Result Value   Glucose, Bld 102 (*)    Calcium 8.8 (*)    All  other components within normal limits  CBC - Abnormal; Notable for the following components:   WBC 10.7 (*)    All other components within normal limits  TROPONIN I (HIGH SENSITIVITY)    EKG EKG Interpretation  Date/Time:  Saturday July 01 2021 21:45:00 EDT Ventricular Rate:  76 PR Interval:  150 QRS Duration: 78 QT Interval:  394 QTC Calculation: 443 R Axis:   96 Text Interpretation: Normal sinus rhythm with sinus arrhythmia Rightward axis Nonspecific T wave abnormality Abnormal ECG since 2019, no changes Confirmed by Noemi Chapel [phone]) on 07/01/2021 9:49:34  PM  Radiology DG Chest 2 View  Result Date: 07/01/2021 CLINICAL DATA:  Chest pain. Progressive left upper chest pain for 3 days. EXAM: CHEST - 2 VIEW COMPARISON:  Radiograph 09/03/2018 FINDINGS: Atrial septal closure device in place.The cardiomediastinal contours are normal. The lungs are clear. Pulmonary vasculature is normal. No consolidation, pleural effusion, or pneumothorax. No acute osseous abnormalities are seen. IMPRESSION: 1. No acute chest findings. 2. Atrial septal closure device in place. Electronically Signed   By: Keith Rake M.D.   On: 07/01/2021 22:28    Procedures Procedures   Medications Ordered in ED Medications - No data to display  ED Course  I have reviewed the triage vital signs and the nursing notes.  Pertinent labs & imaging results that were available during my care of the patient were reviewed by me and considered in my medical decision making (see chart for details).    MDM Rules/Calculators/A&P                           Patient is 25 y/o female complaining of chest pain and arm weakness x 4 days. Patient has history of ASD closure in 2013 and left/right heart cath in 2016. She has appointment with cardiology scheduled next week. Differential to include ACS, arrhythmia, pneumonia, indigestion, radiculopathy.  CXR shows no sign of infectious process. EKG shows normal sinus rhythm with  sinus arrhythmia, records show no changes since 2019. CBC shows no leukocytosis. BMP unremarkable. Troponin negative. Due to duration and character of patient's symptoms, she likely has a radiculopathy in her cervical spine. Patient does not meet criteria for admission and inpatient treatment at this time, and would benefit from an outpatient course of steroid taper for her symptoms.   Final Clinical Impression(s) / ED Diagnoses Final diagnoses:  Radiculopathy, unspecified spinal region    Rx / DC Orders ED Discharge Orders          Ordered    methylPREDNISolone (MEDROL DOSEPAK) 4 MG TBPK tablet        07/01/21 2325             Jaycelyn Orrison T, PA-C 07/01/21 2336    Noemi Chapel, MD 07/02/21 [phone]

## 2021-07-01 NOTE — ED Triage Notes (Signed)
Pt seen Wednesday in [place] for same. Dx with Valvular Heart Disease. Pt reports no relief of symptoms and left sided arm numbess. Pt reports 2 heart sugeries in past. Cardiology follow up appt scheduled on Thursday.

## 2021-07-01 NOTE — Discharge Instructions (Addendum)
Your cardiac workup today was unremarkable. Keep your appointment with your cardiologist next week.  The duration and character of your symptoms likely points to radiculopathy. You are being prescribed a steroid taper for the next 6 days which should help your symptoms. Return to the ED for new or worsening symptoms.

## 2021-07-02 NOTE — Progress Notes (Signed)
Cardiology Office Note:    Date:  07/06/2021   ID:  [name], DOB 22-May-1996, MRN [id number]  PCP:  Celene Squibb, MD  Cardiologist:  None   Referring MD: Celene Squibb, MD   Chief Complaint  Patient presents with   Follow-up    Recurring left arm numbness and heaviness   Hospitalization Follow-up    Recurring noncardiac left chest discomfort     History of Present Illness:    Rebecca Brown is a 25 y.o. female with a hx of  ASD surgical repair , palpitations, referred for cardiac arrhythmia.  2 recent emergency room visits for left arm numbness and heaviness with noncardiac sounding left lateral chest pain.  Reileigh had ASD repair in 2013.  She works at the [employer].  8 days ago while eating lunch her left arm developed numbness and heaviness and she had atypical left lateral chest discomfort (tingling/sharp) occasionally associated with arm heaviness.  The symptoms are not exertion related and are not aggravated by moderate physical activity.  Intensity is not severe as patient is able to work with the discomfort present..  This past Saturday she had a recurrence of a similar episode.  On both occasions she was seen in the emergency room.  On the first occasion she [facility]  That physician did an EKG and said that her heart rate was too slow.  There was no evidence of bradycardia in the emergency room.  No vital signs were done by the physician and according to the patient had a physical exam was not done.  EKG in the emergency room was unremarkable.  She was discharged home.  On Saturday she stayed in the emergency room for approximately 2 hours, an EKG was done, and other testing was performed.  She was discharged home.  Since that time she has had no recurrence of left arm weakness/numbness and left chest pain.  She is here today with her partner.  She does vape.  She has a family history of stroke  (grandmother).   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   Fibrocystic breast changes, bilateral 02/23/2016   Irritable bowel syndrome with both constipation and diarrhea 05/09/2017   Menorrhagia with regular cycle 01/29/2018   Right ovarian cyst 03/10/2013   Skin tag 01/29/2018   Verruca vulgaris 02/26/2018    Past Surgical History:  Procedure Laterality Date   Amplatzer occluder  06/10/2012   BREAST SURGERY  06/27/12   bil br masses   WISDOM TOOTH EXTRACTION      Current Medications: Current Meds  Medication Sig   methylPREDNISolone (MEDROL DOSEPAK) 4 MG TBPK tablet Taper over 6 days     Allergies:   Doxycycline, Hydrocodone, and Lactose intolerance (gi)   Social History   Socioeconomic History   Marital status: Single    Spouse name: Not on file   Number of children: Not on file   Years of education: Not on file   Highest education level: Not on file  Occupational History   Not on file  Tobacco Use   Smoking status: Every Day    Packs/day: 0.50    Types: Cigarettes   Smokeless tobacco: Never  Vaping Use   Vaping Use: Never used  Substance and Sexual Activity   Alcohol use: Yes    Comment: socially    Drug use: No   Sexual activity: Yes    Birth control/protection: None  Other Topics Concern   Not on file  Social History Narrative   Not on file   Social Determinants of Health   Financial Resource Strain: Not on file  Food Insecurity: Not on file  Transportation Needs: Not on file  Physical Activity: Not on file  Stress: Not on file  Social Connections: Not on file     Family History: The patient's family history includes Asthma in an other family member; Blindness in an other family member; Cancer in an other family member; Deafness in an other family member; Hypertension  in her paternal grandfather and paternal grandmother.  ROS:   Please see the history of present illness.    Denies orthopnea, PND, and palpitations.  She vapes.  She is physically inactive.  All other systems reviewed and are negative.  EKGs/Labs/Other Studies Reviewed:    The following studies were reviewed today:  CARDIAC MONITOR 2019 Study Highlights    NSR with rare PAC and PVC activity No atrial fibrillation, ventricular tachycardia, or other significant arrhythmia. No correlation between patient activated events and dysrhythmia.  ECHOCARDIOGRAM 2019:  Study Conclusions   - Left ventricle: The cavity size was normal. Wall thickness was    normal. Systolic function was normal. The estimated ejection    fraction was in the range of 50% to 55%. Wall motion was normal;    there were no regional wall motion abnormalities. Left    ventricular diastolic function parameters were normal.  - Mitral valve: There was mild regurgitation.  - Right ventricle: The cavity size was normal. Wall thickness was    normal.  - Atrial septum: An Amplatzer closure device was present well    seated with no Doppler evidence of shunt.   EKG:  EKG normal sinus rhythm, nonspecific T wave abnormality, biatrial abnormality.  Change compared to August 13 and August 10 in the emergency room at [facility].  Recent Labs: 07/01/2021: BUN 14; Creatinine, Ser 0.93; Hemoglobin 13.5; Platelets 372; Potassium 3.5; Sodium 135  Recent Lipid Panel No results found for: CHOL, TRIG, HDL, CHOLHDL, VLDL, LDLCALC, LDLDIRECT  Physical Exam:    VS:  BP 118/68   Pulse 91   Ht '5\' 3"'$  (1.6 m)   Wt 170 lb (77.1 kg)   SpO2 93%   BMI 30.11 kg/m     Wt Readings from Last 3 Encounters:  07/06/21 170 lb (77.1 kg)  07/01/21 175 lb (79.4 kg)  09/26/18 157 lb 6.4 oz (71.4 kg)     GEN: Slightly overweight. No acute distress HEENT: Normal NECK: No JVD. LYMPHATICS: No lymphadenopathy CARDIAC: No murmur. RRR no gallop,  or edema. VASCULAR:  Normal Pulses. No bruits. RESPIRATORY:  Clear to auscultation without rales, wheezing or rhonchi  ABDOMEN: Soft, non-tender, non-distended, No pulsatile mass, MUSCULOSKELETAL: No deformity  SKIN: Warm and dry NEUROLOGIC:  Alert and oriented x 3 PSYCHIATRIC:  Normal affect   ASSESSMENT:    1. Left arm pain   2. 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 Cardiac arrhythmia, unspecified cardiac arrhythmia type   4. Chest pain of uncertain etiology    PLAN:    In order of problems listed above:  She has having left arm numbness and weakness with associated chest pain.  Etiology is uncertain.  We will perform an exercise  treadmill test and repeat an echocardiogram given her history of ASD repair by percutaneous means.  If there is no evidence of cardiac abnormality, she should probably have a neuro evaluation.  The arm heaviness especially in duration, raises question of a neurological symptom.  I have asked her to speak with primary care to consider whether further evaluation of noncardiac should be done.  She was not easily convinced that neurology consultation should occur. Normal cardiac exam.  Repeat 2D Doppler echocardiogram for comparison with 2019 will be done.  The patient is concerned that the percutaneously placed ASD device could be causing symptoms.  For this reason further evaluation will be done. A prior monitor performed in 2019 did not reveal any significant arrhythmia. Exercise treadmill test.  Stop vaping.   Significant anxiety concerning these symptoms.  They do not seem to be cardiac.  Exercise treadmill test and echocardiogram will follow-up to ensure adequate exercise tolerance, rule out any evidence of ischemia, and check the structural integrity of her heart.   Medication Adjustments/Labs and Tests Ordered: Current medicines are reviewed at length with the patient today.  Concerns regarding medicines are outlined above.  Orders  Placed This Encounter  Procedures   Exercise Tolerance Test   EKG 12-Lead   ECHOCARDIOGRAM COMPLETE    No orders of the defined types were placed in this encounter.   Patient Instructions  Medication Instructions:  Your physician recommends that you continue on your current medications as directed. Please refer to the Current Medication list given to you today.  *If you need a refill on your cardiac medications before your next appointment, please call your pharmacy*   Lab Work: None If you have labs (blood work) drawn today and your tests are completely normal, you will receive your results only by: [place] (if you have MyChart) OR A paper copy in the mail If you have any lab test that is abnormal or we need to change your treatment, we will call you to review the results.   Testing/Procedures: Your physician has requested that you have an echocardiogram. Echocardiography is a painless test that uses sound waves to create images of your heart. It provides your doctor with information about the size and shape of your heart and how well your heart's chambers and valves are working. This procedure takes approximately one hour. There are no restrictions for this procedure.  Your physician has requested that you have an exercise tolerance test. For further information please visit [email or web address]. Please also follow instruction sheet, as given.   Follow-Up: At [facility], you and your health needs are our priority.  As part of our continuing mission to provide you with exceptional heart care, we have created designated Provider Care Teams.  These Care Teams include your primary Cardiologist (physician) and Advanced Practice Providers (APPs -  Physician Assistants and Nurse Practitioners) who all work together to provide you with the care you need, when you need it.  We recommend signing up for the patient portal called "MyChart".  Sign up information is provided on this  After Visit Summary.  MyChart is used to connect with patients for Virtual Visits (Telemedicine).  Patients are able to view lab/test results, encounter notes, upcoming appointments, etc.  Non-urgent messages can be sent to your provider as well.   To learn more about what you can do with MyChart, go to [email or web address].    Your next appointment:   As needed  The format for your next appointment:   In  Person  Provider:   You may see Daneen Schick, MD or one of the following Advanced Practice Providers on your designated Care Team:   Cecilie Kicks, NP   Other Instructions     Signed, Sinclair Grooms, MD  07/06/2021 2:31 PM    [place]

## 2021-07-06 ENCOUNTER — Ambulatory Visit: Payer: BC Managed Care – PPO | Admitting: Interventional Cardiology

## 2021-07-06 ENCOUNTER — Other Ambulatory Visit: Payer: Self-pay

## 2021-07-06 ENCOUNTER — Encounter: Payer: Self-pay | Admitting: Interventional Cardiology

## 2021-07-06 VITALS — BP 118/68 | HR 91 | Ht 63.0 in | Wt 170.0 lb

## 2021-07-06 DIAGNOSIS — M79602 Pain in left arm: Secondary | ICD-10-CM

## 2021-07-06 DIAGNOSIS — R079 Chest pain, unspecified: Secondary | ICD-10-CM | POA: Diagnosis not present

## 2021-07-06 DIAGNOSIS — I499 Cardiac arrhythmia, unspecified: Secondary | ICD-10-CM

## 2021-07-06 DIAGNOSIS — Q211 Atrial septal defect, unspecified: Secondary | ICD-10-CM

## 2021-07-06 NOTE — Patient Instructions (Signed)
Medication Instructions:  Your physician recommends that you continue on your current medications as directed. Please refer to the Current Medication list given to you today.  *If you need a refill on your cardiac medications before your next appointment, please call your pharmacy*   Lab Work: None If you have labs (blood work) drawn today and your tests are completely normal, you will receive your results only by: [place] (if you have MyChart) OR A paper copy in the mail If you have any lab test that is abnormal or we need to change your treatment, we will call you to review the results.   Testing/Procedures: Your physician has requested that you have an echocardiogram. Echocardiography is a painless test that uses sound waves to create images of your heart. It provides your doctor with information about the size and shape of your heart and how well your heart's chambers and valves are working. This procedure takes approximately one hour. There are no restrictions for this procedure.  Your physician has requested that you have an exercise tolerance test. For further information please visit [email or web address]. Please also follow instruction sheet, as given.   Follow-Up: At [facility], you and your health needs are our priority.  As part of our continuing mission to provide you with exceptional heart care, we have created designated Provider Care Teams.  These Care Teams include your primary Cardiologist (physician) and Advanced Practice Providers (APPs -  Physician Assistants and Nurse Practitioners) who all work together to provide you with the care you need, when you need it.  We recommend signing up for the patient portal called "MyChart".  Sign up information is provided on this After Visit Summary.  MyChart is used to connect with patients for Virtual Visits (Telemedicine).  Patients are able to view lab/test results, encounter notes, upcoming appointments, etc.  Non-urgent  messages can be sent to your provider as well.   To learn more about what you can do with MyChart, go to [email or web address].    Your next appointment:   As needed  The format for your next appointment:   In Person  Provider:   You may see Daneen Schick, MD or one of the following Advanced Practice Providers on your designated Care Team:   Cecilie Kicks, NP   Other Instructions

## 2021-08-03 ENCOUNTER — Ambulatory Visit (HOSPITAL_COMMUNITY): Payer: BC Managed Care – PPO

## 2021-08-07 ENCOUNTER — Encounter (HOSPITAL_COMMUNITY): Payer: Self-pay | Admitting: Interventional Cardiology

## 2021-08-25 ENCOUNTER — Other Ambulatory Visit (HOSPITAL_COMMUNITY): Payer: BC Managed Care – PPO

## 2021-08-25 ENCOUNTER — Encounter (HOSPITAL_COMMUNITY): Payer: Self-pay | Admitting: Interventional Cardiology

## 2021-08-25 ENCOUNTER — Encounter (HOSPITAL_COMMUNITY): Payer: Self-pay

## 2021-09-04 ENCOUNTER — Ambulatory Visit (INDEPENDENT_AMBULATORY_CARE_PROVIDER_SITE_OTHER): Payer: BC Managed Care – PPO | Admitting: Gastroenterology

## 2021-09-04 ENCOUNTER — Encounter (INDEPENDENT_AMBULATORY_CARE_PROVIDER_SITE_OTHER): Payer: Self-pay | Admitting: Gastroenterology

## 2021-09-04 ENCOUNTER — Other Ambulatory Visit: Payer: Self-pay

## 2021-09-04 VITALS — BP 115/79 | HR 76 | Temp 97.5°F | Ht 63.0 in | Wt 164.3 lb

## 2021-09-04 DIAGNOSIS — K582 Mixed irritable bowel syndrome: Secondary | ICD-10-CM

## 2021-09-04 DIAGNOSIS — R634 Abnormal weight loss: Secondary | ICD-10-CM | POA: Diagnosis not present

## 2021-09-04 DIAGNOSIS — R110 Nausea: Secondary | ICD-10-CM | POA: Diagnosis not present

## 2021-09-04 MED ORDER — LINACLOTIDE 145 MCG PO CAPS
145.0000 ug | ORAL_CAPSULE | Freq: Every day | ORAL | 1 refills | Status: AC
Start: 2021-09-04 — End: ?

## 2021-09-04 NOTE — Progress Notes (Signed)
[name], M.D. Gastroenterology & Hepatology [facility] [address] [place], [place] [address] Primary Care Physician: Celene Squibb, MD [place] [address]  Referring MD: PCP  Chief Complaint: Chronic constipation, abdominal pain and nausea  History of Present Illness: Rebecca Brown is a 25 y.o. female with past medical history of ASD status postrepair, anxiety, fibroadenoma of the breast, IBS , who presents for evaluation of chronic constipation, abdominal pain and nausea.  The patient reports that she believes close to 10 years ago she started noticing episodes of constipation that lasted for 1.5 -2 weeks and then this was followed by a scant amount of diarrhea. She reports that this cycle has been ongoing for multiple years, but for the last month she has noticed some associated nausea and constant pain in the epigastric area and in the R flank which has limited her food intake.  Describes the pain as a pressure. She had presented having bloating when she was not able to move her bowels. She had to strain significantly to have a bowel movement for multiple years. The patient denies having any  fever, chills, hematochezia, melena, hematemesis, jaundice, pruritus. She reports that she has lost possibly 9 lb  due to her worsening nausea and poor appetite.  As per of her investigations, she has been evaluated at [facility].  Available test in care everywhere from 07/11/2021 show normal CMP with presence of AST of 12, ALT of 11, alkaline phosphatase 100, total bilirubin 0.3, albumin 4.5, potassium 4.7, electrolytes within normal limits, normal renal function with creatinine of 0.76,, glucose of 94, CBC normal with blood cell count of 10.3, hemoglobin 14.1 and platelets of 418.  Due to concern of biliary disease, she underwent HIDA scan on 08/22/2021 that showed patent cystic duct and common bile duct with  normal ejection fraction.  Notably, I do not see results of any abdominal ultrasound.  The patient states that she had a CT scan performed at some point during September at [facility] but no report is available.  As part of the management for diarrhea and fatigue, the patient was given a prescription by her PCP to take ciprofloxacin 500 mg twice a day for 1 week.  Last UXN:ATFTD Last Colonoscopy:never  FHx: grandfather had GI bleeding, neg for any gastrointestinal/liver disease, no malignancies Social: quit  smoking 1 month ago, neg alcohol or illicit drug use Surgical: no abdominal surgeries  Past Medical History: 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   Fibrocystic breast changes, bilateral 02/23/2016   Irritable bowel syndrome with both constipation and diarrhea 05/09/2017   Menorrhagia with regular cycle 01/29/2018   Right ovarian cyst 03/10/2013   Skin tag 01/29/2018   Verruca vulgaris 02/26/2018    Past Surgical History: Past Surgical History:  Procedure Laterality Date   Amplatzer occluder  06/10/2012   BREAST SURGERY  06/27/12   bil br masses   WISDOM TOOTH EXTRACTION      Family History: Family History  Problem Relation Age of Onset   Asthma Other    Cancer Other    Deafness Other    Blindness Other    Hypertension Paternal Grandfather    Hypertension Paternal Grandmother     Social History: Social History  Tobacco Use  Smoking Status Former   Packs/day: 0.50   Types: Cigarettes   Quit date: 08/05/2021   Years since quitting: 0.0  Smokeless Tobacco Never   Social History   Substance and Sexual Activity  Alcohol Use Yes   Comment: socially    Social History   Substance and Sexual Activity  Drug Use No    Allergies: Allergies  Allergen Reactions   Doxycycline  Anaphylaxis   Hydrocodone Anaphylaxis   Lactose Intolerance (Gi)     Medications: Current Outpatient Medications  Medication Sig Dispense Refill   linaclotide (LINZESS) 145 MCG CAPS capsule Take 1 capsule (145 mcg total) by mouth daily. 90 capsule 1   ondansetron (ZOFRAN-ODT) 4 MG disintegrating tablet Take 4 mg by mouth every 8 (eight) hours as needed.     No current facility-administered medications for this visit.    Review of Systems: GENERAL: negative for malaise, night sweats HEENT: No changes in hearing or vision, no nose bleeds or other nasal problems. NECK: Negative for lumps, goiter, pain and significant neck swelling RESPIRATORY: Negative for cough, wheezing CARDIOVASCULAR: Negative for chest pain, leg swelling, palpitations, orthopnea GI: SEE HPI MUSCULOSKELETAL: Negative for joint pain or swelling, back pain, and muscle pain. SKIN: Negative for lesions, rash PSYCH: Negative for sleep disturbance, mood disorder and recent psychosocial stressors. HEMATOLOGY Negative for prolonged bleeding, bruising easily, and swollen nodes. ENDOCRINE: Negative for cold or heat intolerance, polyuria, polydipsia and goiter. NEURO: negative for tremor, gait imbalance, syncope and seizures. The remainder of the review of systems is noncontributory.   Physical Exam: BP 115/79 (BP Location: Right Arm, Patient Position: Sitting, Cuff Size: Normal)   Pulse 76   Temp (!) 97.5 F (36.4 C) (Oral)   Ht 5\' 3"  (1.6 m)   Wt 164 lb 4.8 oz (74.5 kg)   BMI 29.10 kg/m  GENERAL: The patient is AO x3, in no acute distress. HEENT: Head is normocephalic and atraumatic. EOMI are intact. Mouth is well hydrated and without lesions. NECK: Supple. No masses LUNGS: Clear to auscultation. No presence of rhonchi/wheezing/rales. Adequate chest expansion HEART: RRR, normal s1 and s2. ABDOMEN: tender to palpation in the R flank and RLQ, no guarding, no peritoneal signs, and nondistended. BS +. No  masses. EXTREMITIES: Without any cyanosis, clubbing, rash, lesions or edema. NEUROLOGIC: AOx3, no focal motor deficit. SKIN: no jaundice, no rashes   Imaging/Labs: as above  I personally reviewed and interpreted the available labs, imaging and endoscopic files.  Impression and Plan: Rebecca Brown is a 25 y.o. female with past medical history of ASD status postrepair, anxiety, fibroadenoma of the breast, IBS , who presents for evaluation of chronic constipation, abdominal pain and nausea.  Patient presented chronic symptoms characterized mainly by constipation but these have led to significant abdominal pain and poor oral intake with weight loss.  It is very likely that given her age and absence of other red flag signs besides weight loss, this is related to IBS-C.  She may benefit from starting an agent to improve her bowel movement frequency such as Linzess 145 mcg every day, samples will be provided today.  We will check for celiac disease and thyroid function test today.  Also, I asked the patient to fax the report of her most recent CT scan performed last month to make sure there is no organic alteration found during this scan.  I do not consider that she has any alterations that are consistent with gallbladder pathology at the moment.  -  Start Linzess 145 mcg qday - Patient will request report of most recent CT scan performed at [facility] - Check celiac serology and TSH - RTC 2 months  All questions were answered.      [name], MD Gastroenterology and Hepatology [facility]

## 2021-09-04 NOTE — Patient Instructions (Addendum)
Start Linzess 145 mcg qday Please fax the report of most recent CT scan performed at [facility] Perform blood workup

## 2021-09-06 ENCOUNTER — Telehealth (HOSPITAL_COMMUNITY): Payer: Self-pay | Admitting: Interventional Cardiology

## 2021-09-06 ENCOUNTER — Telehealth (INDEPENDENT_AMBULATORY_CARE_PROVIDER_SITE_OTHER): Payer: Self-pay | Admitting: Gastroenterology

## 2021-09-06 NOTE — Telephone Encounter (Signed)
I received the report of the CT of the abdomen and pelvis with IV contrast performed at [facility] [place] at [place] on 08/19/2021.  Report stated the patient had presence of a right ovarian cyst measuring 2 cm, hepatic steatosis, small fluid density in the mesentery adjacent to the descending colon but no inflammation related to this, a small nodule was present in the left lower lobe.  No other abnormalities were found in her abdomen explain her chronic constipation.  [name], MD Gastroenterology and Hepatology [facility]

## 2021-09-06 NOTE — Telephone Encounter (Signed)
Just an FYI. We have made several attempts to contact this patient including sending a letter to schedule or reschedule their echocardiogram. We will be removing the patient from the echo WQ.  08/25/21 NO SHOWED-MAILED LETTER [facility]    Thank you

## 2021-10-02 ENCOUNTER — Encounter (INDEPENDENT_AMBULATORY_CARE_PROVIDER_SITE_OTHER): Payer: Self-pay

## 2021-10-05 ENCOUNTER — Other Ambulatory Visit: Payer: Self-pay

## 2021-10-05 ENCOUNTER — Ambulatory Visit (INDEPENDENT_AMBULATORY_CARE_PROVIDER_SITE_OTHER): Payer: BC Managed Care – PPO | Admitting: Gastroenterology

## 2021-10-05 ENCOUNTER — Encounter (INDEPENDENT_AMBULATORY_CARE_PROVIDER_SITE_OTHER): Payer: Self-pay | Admitting: Gastroenterology

## 2021-10-05 VITALS — BP 126/90 | HR 101 | Temp 98.3°F | Ht 63.0 in | Wt 153.3 lb

## 2021-10-05 DIAGNOSIS — R110 Nausea: Secondary | ICD-10-CM | POA: Diagnosis not present

## 2021-10-05 DIAGNOSIS — K581 Irritable bowel syndrome with constipation: Secondary | ICD-10-CM

## 2021-10-05 DIAGNOSIS — K219 Gastro-esophageal reflux disease without esophagitis: Secondary | ICD-10-CM | POA: Diagnosis not present

## 2021-10-05 DIAGNOSIS — R1084 Generalized abdominal pain: Secondary | ICD-10-CM

## 2021-10-05 MED ORDER — ONDANSETRON 4 MG PO TBDP: 4.0000 mg | ORAL_TABLET | Freq: Three times a day (TID) | ORAL | 0 refills | Status: AC | PRN

## 2021-10-05 MED ORDER — PANTOPRAZOLE SODIUM 40 MG PO TBEC: 40.0000 mg | DELAYED_RELEASE_TABLET | Freq: Every day | ORAL | 1 refills | Status: AC

## 2021-10-05 NOTE — Progress Notes (Signed)
Referring Provider: Celene Squibb, MD Primary Care Physician:  Celene Squibb, MD Primary GI Physician: Jenetta Downer  Chief Complaint  Patient presents with   Irritable Bowel Syndrome    Follow up on IBS and starting linzess. States since starting med she is having anxiety, nausea and has lost 10 lbs in one month.     HPI:   Rebecca Brown is a 25 y.o. female with past medical history of AST status post repair, anxiety, fibroadenoma of the breast, IBS.   Patient presenting today for follow up of IBS. Last seen in clinic Oct 2022  Constipation:  ongoing x approx 10 years, previously having bouts of constipation lasting 1.5-2 weeks with intermittent diarrhea. She was seen in October for constipation, nausea and ongoing abdominal pain, she was not taking anythign for her constipation at that time and was Started on Linzess 155mcg daily, TSH and celiac serology were ordered but not completed. Patient had CT A/P at [facility] on 08/19/21 without acute findings to explain her constipation. She reports that she is having 4-5 episodes of diarrhea per day since starting Linzess 145. She is having continued diffuse abdominal pain. She had previous HIDA scan that was normal. She is having constant nausea without vomiting. She has no solid stools. She reports that she is eating but not as much as normal because she has pain in her lower R quadrant after eating. She denies abdominal pain improvement with BM.  Denies black stools or rectal bleeding.   She reports that she is having heartburn daily with a lot of belching. She denies dysphagia or odynophagia. She takes tums approx every day. She has tried to eliminate certain foods without improvement in symptoms.  NSAID use:no NSAID use Social hx:neg alcohol or illicit drugs, previous smoker Fam QP:YPPJK dad had Gi bleed  Last Colonoscopy:n/a Last Endoscopy:n/a  Past Medical History:  Diagnosis Date   Acute gastritis without hemorrhage 05/09/2017    Anxiety    Breast tenderness 01/29/2018   Cardiac abnormality    Percutaneous ASD repaired 06/10/2012   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   Fibrocystic breast changes, bilateral 02/23/2016   Irritable bowel syndrome with both constipation and diarrhea 05/09/2017   Menorrhagia with regular cycle 01/29/2018   Right ovarian cyst 03/10/2013   Skin tag 01/29/2018   Verruca vulgaris 02/26/2018    Past Surgical History:  Procedure Laterality Date   Amplatzer occluder  06/10/2012   BREAST SURGERY  06/27/12   bil br masses   WISDOM TOOTH EXTRACTION      Current Outpatient Medications  Medication Sig Dispense Refill   linaclotide (LINZESS) 145 MCG CAPS capsule Take 1 capsule (145 mcg total) by mouth daily. 90 capsule 1   ondansetron (ZOFRAN-ODT) 4 MG disintegrating tablet Take 4 mg by mouth every 8 (eight) hours as needed. (Patient not taking: Reported on 10/05/2021)     No current facility-administered medications for this visit.    Allergies as of 10/05/2021 - Review Complete 09/04/2021  Allergen Reaction Noted   Doxycycline Anaphylaxis 07/02/2011   Hydrocodone Anaphylaxis 03/30/2012   Lactose intolerance (gi)  03/30/2012    Family History  Problem Relation Age of Onset   Asthma Other    Cancer Other    Deafness Other    Blindness Other    Hypertension Paternal Grandfather    Hypertension Paternal Grandmother     Social History   Socioeconomic History   Marital status: Single    Spouse name: Not on file  Number of children: Not on file   Years of education: Not on file   Highest education level: Not on file  Occupational History   Not on file  Tobacco Use   Smoking status: Former    Packs/day: 0.50    Types: Cigarettes    Quit date: 08/05/2021    Years since quitting: 0.1   Smokeless tobacco: Never  Vaping Use   Vaping Use: Never used  Substance and Sexual Activity   Alcohol use: Yes    Comment: socially    Drug use: No   Sexual activity: Yes    Birth  control/protection: None  Other Topics Concern   Not on file  Social History Narrative   Not on file   Social Determinants of Health   Financial Resource Strain: Not on file  Food Insecurity: Not on file  Transportation Needs: Not on file  Physical Activity: Not on file  Stress: Not on file  Social Connections: Not on file   Review of systems General: negative for malaise, night sweats, fever, chills, +weight loss since starting linzess Neck: Negative for lumps, goiter, pain and significant neck swelling Resp: Negative for cough, wheezing, dyspnea at rest CV: Negative for chest pain, leg swelling, palpitations, orthopnea GI: denies melena, hematochezia, dysphagia, odyonophagia, early satiety or unintentional weight loss. +nausea +abdominal pain + MSK: Negative for joint pain or swelling, back pain, and muscle pain. Derm: Negative for itching or rash Psych: Denies depression, anxiety, memory loss, confusion. No homicidal or suicidal ideation.  Heme: Negative for prolonged bleeding, bruising easily, and swollen nodes. Endocrine: Negative for cold or heat intolerance, polyuria, polydipsia and goiter. Neuro: negative for tremor, gait imbalance, syncope and seizures. The remainder of the review of systems is noncontributory.  Physical Exam: BP 126/90 (BP Location: Left Arm, Patient Position: Sitting, Cuff Size: Large)   Pulse (!) 101   Temp 98.3 F (36.8 C) (Oral)   Ht 5\' 3"  (1.6 m)   Wt 153 lb 4.8 oz (69.5 kg)   BMI 27.16 kg/m  General:   Alert and oriented. No distress noted. Pleasant and cooperative.  Head:  Normocephalic and atraumatic. Eyes:  Conjuctiva clear without scleral icterus. Mouth:  Oral mucosa pink and moist. Good dentition. No lesions. Heart: Normal rate and rhythm, s1 and s2 heart sounds present.  Lungs: Clear lung sounds in all lobes. Respirations equal and unlabored. Abdomen:  +BS, soft, and non-distended. No rebound or guarding. No HSM or masses noted.  Generalized tenderness to palpation of abdomen Derm: No palmar erythema or jaundice Msk:  Symmetrical without gross deformities. Normal posture. Extremities:  Without edema. Neurologic:  Alert and  oriented x4 Psych:  Alert and cooperative. Normal mood and affect.  Invalid input(s): 6 MONTHS   ASSESSMENT: Rebecca Brown is a 25 y.o. female presenting today for follow up of constipation/IBS.  Started on linzess 123mcg at last visit in Sept, she is having 4-5 episodes of diarrhea per day now with continued nausea, she has lost weight due to diarrhea. Using zofran PRN for nausea. She has not had a solid stool since starting this linzess. We discussed decreasing linzess dosing or trying something else, patient prefers to stop linzess and try something else for constipation. We will proceed with miralax, patient given instructions for miralax taper.  She is also having daily heartburn/reflux, taking tums on a daily basis. No dysphagia or odyophagia. We will start protonix 40mg  daily, 30 mins prior to breakfast. I suspect some of her nausea and  abdominal discomfort could also be from uncontrolled GERD. She does not take any NSAIDs or drink alcohol, she has no rectal bleeding or melena.    PLAN:  Start protonix 40mg  once daily, 30 mins prior to breakfast 2. Start miralax (taper instructions provided), stop linzess 3. Low FODMAP diet 4. Zofran refilled, use sparingly as this can worsen constipation 5. Consider endoscopic evaluation if symptoms do not improve  Follow Up: 3 months  Shonice Wrisley L. Alver Sorrow, MSN, APRN, AGNP-C Adult-Gerontology Nurse Practitioner [facility]

## 2021-10-05 NOTE — Patient Instructions (Signed)
Stop linzess. We will start miralax for your constipation, you can buy this over the counter I have also provided information on low FODMAP diet to try for your symptoms. Refill of zofran sent, please be mindful this can worsen constipation.   Start taking Miralax 1 capful every day for one week. If bowel movements do not improve, increase to 1 capful every 12 hours. If after two weeks there is no improvement, increase to 1 capful every 8 hours  I have sent protonix 40mg  for your acid reflux, please take this once daily in the morning 30 minutes prior to breakfast. Please stay upright 2-3 hours after eating prior to lying down. Avoid caffeine, alcohol, spicy, greasy foods, as these can worsen reflux.  Please give me a call in a few weeks if your symptoms are not improved

## 2021-10-19 ENCOUNTER — Other Ambulatory Visit: Payer: Self-pay

## 2021-10-19 ENCOUNTER — Ambulatory Visit
Admission: EM | Admit: 2021-10-19 | Discharge: 2021-10-19 | Disposition: A | Discharge status: Home / Self Care | Payer: BC Managed Care – PPO | Attending: Urgent Care | Admitting: Urgent Care

## 2021-10-19 ENCOUNTER — Ambulatory Visit (INDEPENDENT_AMBULATORY_CARE_PROVIDER_SITE_OTHER): Payer: BC Managed Care – PPO | Admitting: Gastroenterology

## 2021-10-19 ENCOUNTER — Encounter: Payer: Self-pay | Admitting: Emergency Medicine

## 2021-10-19 DIAGNOSIS — B3731 Acute candidiasis of vulva and vagina: Secondary | ICD-10-CM | POA: Insufficient documentation

## 2021-10-19 LAB — POCT URINALYSIS DIP (MANUAL ENTRY)
Bilirubin, UA: NEGATIVE
Glucose, UA: NEGATIVE mg/dL
Ketones, POC UA: NEGATIVE mg/dL
Leukocytes, UA: NEGATIVE
Nitrite, UA: NEGATIVE
Protein Ur, POC: NEGATIVE mg/dL
Spec Grav, UA: 1.02 (ref 1.010–1.025)
Urobilinogen, UA: 0.2 E.U./dL
pH, UA: 5.5 (ref 5.0–8.0)

## 2021-10-19 LAB — POCT URINE PREGNANCY: Preg Test, Ur: NEGATIVE

## 2021-10-19 MED ORDER — FLUCONAZOLE 150 MG PO TABS: 150.0000 mg | ORAL_TABLET | ORAL | 0 refills | Status: AC

## 2021-10-19 NOTE — ED Triage Notes (Signed)
Vaginal itching and discharge x a few months.  Burning on urination.

## 2021-10-19 NOTE — ED Provider Notes (Signed)
[place]   MRN: [id number] DOB: 1996-02-22  Subjective:   Rebecca Brown is a 25 y.o. female presenting for several month history of persistent vaginal itching and discharge.  Has also had dysuria, urinary frequency.  Low concern for sexually transmitted infection.  Has sex with 47 female partner, his unprotected sex.  Tries to hydrate very well with plain water.  Limits urinary irritants, has sweet tea sometimes, soda sometimes.  Does not believe she has a gynecologist.  No current facility-administered medications for this encounter.  Current Outpatient Medications:    linaclotide (LINZESS) 145 MCG CAPS capsule, Take 1 capsule (145 mcg total) by mouth daily., Disp: 90 capsule, Rfl: 1   ondansetron (ZOFRAN-ODT) 4 MG disintegrating tablet, Take 1 tablet (4 mg total) by mouth every 8 (eight) hours as needed for nausea., Disp: 20 tablet, Rfl: 0   pantoprazole (PROTONIX) 40 MG tablet, Take 1 tablet (40 mg total) by mouth daily., Disp: 60 tablet, Rfl: 1   Allergies  Allergen Reactions   Doxycycline Anaphylaxis   Hydrocodone Anaphylaxis   Lactose Intolerance (Gi)     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   Fibrocystic breast changes, bilateral 02/23/2016   Irritable bowel syndrome with both constipation and diarrhea 05/09/2017   Menorrhagia with regular cycle 01/29/2018   Right ovarian cyst 03/10/2013   Skin tag 01/29/2018   Verruca vulgaris 02/26/2018     Past Surgical History:  Procedure Laterality Date   Amplatzer occluder  06/10/2012   BREAST SURGERY  06/27/12   bil br masses   WISDOM TOOTH EXTRACTION      Family History  Problem Relation Age of Onset   Asthma Other    Cancer Other    Deafness Other    Blindness Other    Hypertension Paternal Grandfather    Hypertension  Paternal Grandmother     Social History   Tobacco Use   Smoking status: Former    Packs/day: 0.50    Types: Cigarettes    Quit date: 08/05/2021    Years since quitting: 0.2   Smokeless tobacco: Never  Vaping Use   Vaping Use: Never used  Substance Use Topics   Alcohol use: Yes    Comment: socially    Drug use: No    ROS   Objective:   Vitals: BP 121/83 (BP Location: Right Arm)   Pulse 95   Temp 97.8 F (36.6 C) (Oral)   Resp 18   LMP 10/13/2021 (Exact Date)   SpO2 98%   Physical Exam Constitutional:      General: She is not in acute distress.    Appearance: Normal appearance. She is well-developed. She is not ill-appearing, toxic-appearing or diaphoretic.  HENT:     Head: Normocephalic and atraumatic.     Nose: Nose normal.     Mouth/Throat:     Mouth: Mucous membranes are moist.     Pharynx: Oropharynx is clear.  Eyes:     General: No scleral icterus.       Right eye: No discharge.        Left eye: No discharge.     Extraocular Movements: Extraocular movements intact.     Conjunctiva/sclera: Conjunctivae normal.     Pupils: Pupils are equal, round, and reactive to light.  Cardiovascular:  Rate and Rhythm: Normal rate.  Pulmonary:     Effort: Pulmonary effort is normal.  Abdominal:     General: Bowel sounds are normal. There is no distension.     Palpations: Abdomen is soft. There is no mass.     Tenderness: There is no abdominal tenderness. There is no right CVA tenderness, left CVA tenderness, guarding or rebound.  Skin:    General: Skin is warm and dry.  Neurological:     General: No focal deficit present.     Mental Status: She is alert and oriented to person, place, and time.  Psychiatric:        Mood and Affect: Mood normal.        Behavior: Behavior normal.        Thought Content: Thought content normal.        Judgment: Judgment normal.   Results for orders placed or performed during the hospital encounter of 10/19/21 (from the past 24  hour(s))  POCT urinalysis dipstick     Status: Abnormal   Collection Time: 10/19/21  6:50 PM  Result Value Ref Range   Color, UA yellow yellow   Clarity, UA clear clear   Glucose, UA negative negative mg/dL   Bilirubin, UA negative negative   Ketones, POC UA negative negative mg/dL   Spec Grav, UA 1.020 1.010 - 1.025   Blood, UA trace-lysed (A) negative   pH, UA 5.5 5.0 - 8.0   Protein Ur, POC negative negative mg/dL   Urobilinogen, UA 0.2 0.2 or 1.0 E.U./dL   Nitrite, UA Negative Negative   Leukocytes, UA Negative Negative  POCT urine pregnancy     Status: None   Collection Time: 10/19/21  6:50 PM  Result Value Ref Range   Preg Test, Ur Negative Negative    Assessment and Plan :   PDMP not reviewed this encounter.  1. Yeast vaginitis    Will cover for yeast vaginitis with fluconazole.  Labs pending, will treat as appropriate otherwise. Counseled patient on potential for adverse effects with medications prescribed/recommended today, ER and return-to-clinic precautions discussed, patient verbalized understanding.    Jaynee Eagles, [place] 10/19/21 [id number]

## 2021-10-20 ENCOUNTER — Telehealth (HOSPITAL_COMMUNITY): Payer: Self-pay | Admitting: Emergency Medicine

## 2021-10-20 LAB — CERVICOVAGINAL ANCILLARY ONLY
Bacterial Vaginitis (gardnerella): POSITIVE — AB
Candida Glabrata: NEGATIVE
Candida Vaginitis: NEGATIVE
Chlamydia: NEGATIVE
Comment: NEGATIVE
Comment: NEGATIVE
Comment: NEGATIVE
Comment: NEGATIVE
Comment: NEGATIVE
Comment: NORMAL
Neisseria Gonorrhea: NEGATIVE
Trichomonas: NEGATIVE

## 2021-10-20 MED ORDER — METRONIDAZOLE 500 MG PO TABS: 500.0000 mg | ORAL_TABLET | Freq: Two times a day (BID) | ORAL | 0 refills | Status: AC

## 2022-01-25 ENCOUNTER — Ambulatory Visit (INDEPENDENT_AMBULATORY_CARE_PROVIDER_SITE_OTHER): Payer: BC Managed Care – PPO | Admitting: Gastroenterology

## 2022-03-05 ENCOUNTER — Ambulatory Visit (INDEPENDENT_AMBULATORY_CARE_PROVIDER_SITE_OTHER): Payer: BC Managed Care – PPO | Admitting: Gastroenterology

## 2022-03-05 ENCOUNTER — Encounter (INDEPENDENT_AMBULATORY_CARE_PROVIDER_SITE_OTHER): Payer: Self-pay | Admitting: Gastroenterology

## 2022-10-08 ENCOUNTER — Encounter (INDEPENDENT_AMBULATORY_CARE_PROVIDER_SITE_OTHER): Payer: Self-pay | Admitting: Gastroenterology

## 2022-11-28 ENCOUNTER — Other Ambulatory Visit (HOSPITAL_COMMUNITY): Payer: Self-pay | Admitting: Adult Health Nurse Practitioner

## 2022-11-28 DIAGNOSIS — I429 Cardiomyopathy, unspecified: Secondary | ICD-10-CM

## 2022-11-29 ENCOUNTER — Other Ambulatory Visit (HOSPITAL_COMMUNITY): Payer: Self-pay | Admitting: Adult Health Nurse Practitioner

## 2022-11-29 DIAGNOSIS — N63 Unspecified lump in unspecified breast: Secondary | ICD-10-CM
